# Patient Record
Sex: Female | Born: 1940 | Race: White | Hispanic: No | Marital: Single | Smoking: Never smoker
Health system: Southern US, Community
[De-identification: ages and names within clinical notes are randomized; demographics above are authoritative.]

## PROBLEM LIST (undated history)

## (undated) DIAGNOSIS — Z923 Personal history of irradiation: Secondary | ICD-10-CM

## (undated) DIAGNOSIS — Z853 Personal history of malignant neoplasm of breast: Secondary | ICD-10-CM

## (undated) DIAGNOSIS — I10 Essential (primary) hypertension: Secondary | ICD-10-CM

## (undated) DIAGNOSIS — E119 Type 2 diabetes mellitus without complications: Secondary | ICD-10-CM

## (undated) HISTORY — PX: DILATION AND CURETTAGE OF UTERUS: SHX78

## (undated) HISTORY — PX: BREAST LUMPECTOMY: SHX2

---

## 1996-06-23 HISTORY — PX: BREAST BIOPSY: SHX20

## 1997-09-25 ENCOUNTER — Ambulatory Visit (HOSPITAL_COMMUNITY): Admission: RE | Admit: 1997-09-25 | Discharge: 1997-09-25 | Payer: Self-pay | Admitting: Family Medicine

## 1997-12-27 ENCOUNTER — Ambulatory Visit (HOSPITAL_COMMUNITY): Admission: RE | Admit: 1997-12-27 | Discharge: 1997-12-27 | Payer: Self-pay | Admitting: Family Medicine

## 1998-07-12 ENCOUNTER — Other Ambulatory Visit: Admission: RE | Admit: 1998-07-12 | Discharge: 1998-07-12 | Payer: Self-pay | Admitting: Obstetrics and Gynecology

## 1999-04-30 ENCOUNTER — Encounter (INDEPENDENT_AMBULATORY_CARE_PROVIDER_SITE_OTHER): Payer: Self-pay | Admitting: Specialist

## 1999-04-30 ENCOUNTER — Other Ambulatory Visit: Admission: RE | Admit: 1999-04-30 | Discharge: 1999-04-30 | Payer: Self-pay | Admitting: Gynecology

## 1999-08-12 ENCOUNTER — Encounter: Payer: Self-pay | Admitting: Obstetrics and Gynecology

## 1999-08-12 ENCOUNTER — Encounter: Admission: RE | Admit: 1999-08-12 | Discharge: 1999-08-12 | Payer: Self-pay | Admitting: Obstetrics and Gynecology

## 2000-06-23 DIAGNOSIS — Z923 Personal history of irradiation: Secondary | ICD-10-CM

## 2000-06-23 HISTORY — DX: Personal history of irradiation: Z92.3

## 2000-06-23 HISTORY — PX: BREAST BIOPSY: SHX20

## 2000-08-12 ENCOUNTER — Encounter: Payer: Self-pay | Admitting: Family Medicine

## 2000-08-12 ENCOUNTER — Encounter: Admission: RE | Admit: 2000-08-12 | Discharge: 2000-08-12 | Payer: Self-pay | Admitting: Family Medicine

## 2000-08-17 ENCOUNTER — Encounter: Payer: Self-pay | Admitting: Family Medicine

## 2000-08-17 ENCOUNTER — Encounter: Admission: RE | Admit: 2000-08-17 | Discharge: 2000-08-17 | Payer: Self-pay | Admitting: *Deleted

## 2000-08-18 ENCOUNTER — Other Ambulatory Visit: Admission: RE | Admit: 2000-08-18 | Discharge: 2000-08-18 | Payer: Self-pay | Admitting: Family Medicine

## 2000-08-18 ENCOUNTER — Encounter: Payer: Self-pay | Admitting: Family Medicine

## 2000-08-18 ENCOUNTER — Encounter: Admission: RE | Admit: 2000-08-18 | Discharge: 2000-08-18 | Payer: Self-pay | Admitting: Family Medicine

## 2000-08-18 ENCOUNTER — Encounter (INDEPENDENT_AMBULATORY_CARE_PROVIDER_SITE_OTHER): Payer: Self-pay | Admitting: *Deleted

## 2000-09-01 ENCOUNTER — Encounter: Payer: Self-pay | Admitting: Surgery

## 2000-09-01 ENCOUNTER — Encounter: Admission: RE | Admit: 2000-09-01 | Discharge: 2000-09-01 | Payer: Self-pay | Admitting: Surgery

## 2000-09-03 ENCOUNTER — Encounter (INDEPENDENT_AMBULATORY_CARE_PROVIDER_SITE_OTHER): Payer: Self-pay | Admitting: *Deleted

## 2000-09-03 ENCOUNTER — Encounter: Payer: Self-pay | Admitting: Surgery

## 2000-09-03 ENCOUNTER — Encounter: Admission: RE | Admit: 2000-09-03 | Discharge: 2000-09-03 | Payer: Self-pay | Admitting: Surgery

## 2000-09-03 ENCOUNTER — Ambulatory Visit (HOSPITAL_BASED_OUTPATIENT_CLINIC_OR_DEPARTMENT_OTHER): Admission: RE | Admit: 2000-09-03 | Discharge: 2000-09-03 | Payer: Self-pay | Admitting: Surgery

## 2000-09-11 ENCOUNTER — Encounter: Admission: RE | Admit: 2000-09-11 | Discharge: 2000-12-10 | Payer: Self-pay | Admitting: Radiation Oncology

## 2001-07-06 ENCOUNTER — Encounter: Admission: RE | Admit: 2001-07-06 | Discharge: 2001-07-06 | Payer: Self-pay | Admitting: Radiation Oncology

## 2001-12-14 ENCOUNTER — Other Ambulatory Visit: Admission: RE | Admit: 2001-12-14 | Discharge: 2001-12-14 | Payer: Self-pay | Admitting: Obstetrics and Gynecology

## 2002-07-13 ENCOUNTER — Encounter: Payer: Self-pay | Admitting: Surgery

## 2002-07-13 ENCOUNTER — Encounter: Admission: RE | Admit: 2002-07-13 | Discharge: 2002-07-13 | Payer: Self-pay | Admitting: Surgery

## 2003-03-13 ENCOUNTER — Other Ambulatory Visit: Admission: RE | Admit: 2003-03-13 | Discharge: 2003-03-13 | Payer: Self-pay | Admitting: Obstetrics and Gynecology

## 2003-07-17 ENCOUNTER — Encounter: Admission: RE | Admit: 2003-07-17 | Discharge: 2003-07-17 | Payer: Self-pay | Admitting: Family Medicine

## 2004-04-24 ENCOUNTER — Other Ambulatory Visit: Admission: RE | Admit: 2004-04-24 | Discharge: 2004-04-24 | Payer: Self-pay | Admitting: Obstetrics and Gynecology

## 2004-07-17 ENCOUNTER — Encounter: Admission: RE | Admit: 2004-07-17 | Discharge: 2004-07-17 | Payer: Self-pay | Admitting: Obstetrics and Gynecology

## 2005-07-18 ENCOUNTER — Encounter: Admission: RE | Admit: 2005-07-18 | Discharge: 2005-07-18 | Payer: Self-pay | Admitting: Internal Medicine

## 2005-08-20 ENCOUNTER — Other Ambulatory Visit: Admission: RE | Admit: 2005-08-20 | Discharge: 2005-08-20 | Payer: Self-pay | Admitting: Internal Medicine

## 2006-07-20 ENCOUNTER — Encounter: Admission: RE | Admit: 2006-07-20 | Discharge: 2006-07-20 | Payer: Self-pay | Admitting: Internal Medicine

## 2006-09-22 ENCOUNTER — Other Ambulatory Visit: Admission: RE | Admit: 2006-09-22 | Discharge: 2006-09-22 | Payer: Self-pay | Admitting: Internal Medicine

## 2007-07-23 ENCOUNTER — Encounter: Admission: RE | Admit: 2007-07-23 | Discharge: 2007-07-23 | Payer: Self-pay | Admitting: Internal Medicine

## 2007-09-22 ENCOUNTER — Other Ambulatory Visit: Admission: RE | Admit: 2007-09-22 | Discharge: 2007-09-22 | Payer: Self-pay | Admitting: Internal Medicine

## 2008-07-27 ENCOUNTER — Encounter: Admission: RE | Admit: 2008-07-27 | Discharge: 2008-07-27 | Payer: Self-pay | Admitting: Internal Medicine

## 2008-09-27 ENCOUNTER — Other Ambulatory Visit: Admission: RE | Admit: 2008-09-27 | Discharge: 2008-09-27 | Payer: Self-pay | Admitting: Internal Medicine

## 2009-07-30 ENCOUNTER — Encounter: Admission: RE | Admit: 2009-07-30 | Discharge: 2009-07-30 | Payer: Self-pay | Admitting: Internal Medicine

## 2009-08-02 ENCOUNTER — Encounter: Admission: RE | Admit: 2009-08-02 | Discharge: 2009-08-02 | Payer: Self-pay | Admitting: Internal Medicine

## 2009-10-03 ENCOUNTER — Other Ambulatory Visit: Admission: RE | Admit: 2009-10-03 | Discharge: 2009-10-03 | Payer: Self-pay | Admitting: Internal Medicine

## 2009-10-09 ENCOUNTER — Encounter: Admission: RE | Admit: 2009-10-09 | Discharge: 2009-10-09 | Payer: Self-pay | Admitting: Internal Medicine

## 2010-01-31 ENCOUNTER — Encounter: Admission: RE | Admit: 2010-01-31 | Discharge: 2010-01-31 | Payer: Self-pay | Admitting: Surgery

## 2010-07-13 ENCOUNTER — Other Ambulatory Visit: Payer: Self-pay | Admitting: Surgery

## 2010-07-13 DIAGNOSIS — Z1239 Encounter for other screening for malignant neoplasm of breast: Secondary | ICD-10-CM

## 2010-07-13 DIAGNOSIS — Z1231 Encounter for screening mammogram for malignant neoplasm of breast: Secondary | ICD-10-CM

## 2010-08-01 ENCOUNTER — Ambulatory Visit
Admission: RE | Admit: 2010-08-01 | Discharge: 2010-08-01 | Disposition: A | Discharge status: Home / Self Care | Payer: MEDICARE | Source: Ambulatory Visit | Attending: Surgery | Admitting: Surgery

## 2010-08-01 DIAGNOSIS — Z1231 Encounter for screening mammogram for malignant neoplasm of breast: Secondary | ICD-10-CM

## 2010-10-07 ENCOUNTER — Other Ambulatory Visit (HOSPITAL_COMMUNITY)
Admission: RE | Admit: 2010-10-07 | Discharge: 2010-10-07 | Disposition: A | Discharge status: Home / Self Care | Payer: MEDICARE | Source: Ambulatory Visit | Attending: Internal Medicine | Admitting: Internal Medicine

## 2010-10-07 ENCOUNTER — Other Ambulatory Visit: Payer: Self-pay | Admitting: Internal Medicine

## 2010-10-07 DIAGNOSIS — Z01419 Encounter for gynecological examination (general) (routine) without abnormal findings: Secondary | ICD-10-CM | POA: Insufficient documentation

## 2010-11-08 NOTE — Op Note (Signed)
[place]  Patient:    Tiffany Hunt, Tiffany Hunt                      MRN: [id number] Proc. Date: 09/03/00 Attending:  Currie Paris, M.D. CC:         [place]. Andrey Campanile, M.D.  Daniel L. Eda Paschal, M.D.   Operative Report  CCS# [id number]  PREOPERATIVE DIAGNOSIS:  Ductal carcinoma in situ, left breast.  POSTOPERATIVE DIAGNOSIS:  Ductal carcinoma in situ, left breast.  OPERATION PERFORMED:  Needle guided excision, left breast cancer.  SURGEON:  Currie Paris, M.D.  ANESTHESIA:  General.  INDICATIONS FOR PROCEDURE:  The patient is a 70 year old who recently presented with some calcifications in which on large core biopsy showed cribriform ductal carcinoma in situ.  As this was a small area, she was considered a good candidate for wide local excision using a needle guided technique.  The tumor was in just about the 9 oclock or direct medial position although a little bit more in the lower inner than the upper inner quadrant.  DESCRIPTION OF PROCEDURE:  The patient was brought to the operating room having had a guide wire positioned and the guide wire entered just medial or towards on the sternal side of the prior core biopsy site and tracked towards the nipple.  After satisfactory general anesthesia had been obtained, the breast was prepped and draped.  I did a wide local excision by making an elliptical incision to start just medial to the guide wire and in right about the level of the areolar margin to encompass the old core biopsy site.  I raised a little bit of a flap in superior and inferior directions and then beginning medially, divided tissue down to chest wall and then working from medial to lateral began to divide and separate the breast from the underlying muscle taking the fascia with the specimen so that I had full thickness skin, breast and fascia.  The dissection ended just medial to the medial portion of the incision which  was somewhat subareolar.  This was all done with the cautery and I felt I was well around the area of abnormality.  This had a suture to localize it and the specimen was sent for mammography. This subsequently returned that the microclip was in the middle of the specimen and we looked like had at least a centimeter or two margin on any abnormality seen on the mammography.  I then elevated some full thickness breast tissue superiorly and inferiorly to get a little mobility for closure.  I put four metal clips on it to mark the margins for excision and then reapproximated the breast with some 3-0 Vicryl interrupted sutures.  I did a couple of layers one fairly deep in the breast tissue which I anchored to the muscle so that we would have less likelihood of a seroma developing and then a couple of more layers as we came out.  The skin was closed with 4-0 Monocryl subcuticular plus Steri-Strips.  The patient tolerated the procedure well.  There were no operative complications.  All counts were correct.  I did inject approximately 10 cc of Marcaine just prior to closing to help with postoperative analgesia. DD:  09/03/00 TD:  09/03/00 Job: [id number]

## 2011-02-26 ENCOUNTER — Other Ambulatory Visit: Payer: Self-pay | Admitting: Dermatology

## 2011-07-02 ENCOUNTER — Other Ambulatory Visit: Payer: Self-pay | Admitting: Internal Medicine

## 2011-07-02 DIAGNOSIS — Z1231 Encounter for screening mammogram for malignant neoplasm of breast: Secondary | ICD-10-CM

## 2011-08-04 ENCOUNTER — Ambulatory Visit
Admission: RE | Admit: 2011-08-04 | Discharge: 2011-08-04 | Disposition: A | Discharge status: Home / Self Care | Payer: PRIVATE HEALTH INSURANCE | Source: Ambulatory Visit | Attending: Internal Medicine | Admitting: Internal Medicine

## 2011-08-04 DIAGNOSIS — Z1231 Encounter for screening mammogram for malignant neoplasm of breast: Secondary | ICD-10-CM

## 2012-03-03 ENCOUNTER — Other Ambulatory Visit: Payer: Self-pay | Admitting: Dermatology

## 2012-07-06 ENCOUNTER — Other Ambulatory Visit: Payer: Self-pay | Admitting: Internal Medicine

## 2012-07-06 DIAGNOSIS — Z1231 Encounter for screening mammogram for malignant neoplasm of breast: Secondary | ICD-10-CM

## 2012-08-04 ENCOUNTER — Ambulatory Visit: Payer: PRIVATE HEALTH INSURANCE

## 2012-09-01 ENCOUNTER — Ambulatory Visit: Payer: PRIVATE HEALTH INSURANCE

## 2012-09-02 ENCOUNTER — Ambulatory Visit: Payer: PRIVATE HEALTH INSURANCE

## 2012-09-24 ENCOUNTER — Ambulatory Visit
Admission: RE | Admit: 2012-09-24 | Discharge: 2012-09-24 | Disposition: A | Discharge status: Home / Self Care | Payer: Medicare Other | Source: Ambulatory Visit | Attending: Internal Medicine | Admitting: Internal Medicine

## 2012-09-24 DIAGNOSIS — Z1231 Encounter for screening mammogram for malignant neoplasm of breast: Secondary | ICD-10-CM

## 2012-11-08 ENCOUNTER — Other Ambulatory Visit: Payer: Self-pay | Admitting: Internal Medicine

## 2012-11-08 ENCOUNTER — Other Ambulatory Visit (HOSPITAL_COMMUNITY)
Admission: RE | Admit: 2012-11-08 | Discharge: 2012-11-08 | Disposition: A | Discharge status: Home / Self Care | Payer: Medicare Other | Source: Ambulatory Visit | Attending: Internal Medicine | Admitting: Internal Medicine

## 2012-11-08 DIAGNOSIS — Z1151 Encounter for screening for human papillomavirus (HPV): Secondary | ICD-10-CM | POA: Insufficient documentation

## 2012-11-08 DIAGNOSIS — Z01419 Encounter for gynecological examination (general) (routine) without abnormal findings: Secondary | ICD-10-CM | POA: Insufficient documentation

## 2013-07-27 ENCOUNTER — Ambulatory Visit (HOSPITAL_COMMUNITY): Payer: Medicare Other | Admitting: Anesthesiology

## 2013-07-27 ENCOUNTER — Encounter (HOSPITAL_COMMUNITY): Payer: Self-pay | Admitting: *Deleted

## 2013-07-27 ENCOUNTER — Encounter (HOSPITAL_COMMUNITY)
Admission: AD | Disposition: A | Discharge status: Home / Self Care | Payer: Self-pay | Source: Ambulatory Visit | Attending: Orthopaedic Surgery

## 2013-07-27 ENCOUNTER — Ambulatory Visit (HOSPITAL_COMMUNITY)
Admission: AD | Admit: 2013-07-27 | Discharge: 2013-07-27 | Disposition: A | Discharge status: Home / Self Care | Payer: Medicare Other | Source: Ambulatory Visit | Attending: Orthopaedic Surgery | Admitting: Orthopaedic Surgery

## 2013-07-27 ENCOUNTER — Ambulatory Visit (HOSPITAL_COMMUNITY): Payer: Medicare Other

## 2013-07-27 ENCOUNTER — Encounter (HOSPITAL_COMMUNITY): Payer: Medicare Other | Admitting: Anesthesiology

## 2013-07-27 DIAGNOSIS — Z853 Personal history of malignant neoplasm of breast: Secondary | ICD-10-CM | POA: Insufficient documentation

## 2013-07-27 DIAGNOSIS — W19XXXA Unspecified fall, initial encounter: Secondary | ICD-10-CM | POA: Insufficient documentation

## 2013-07-27 DIAGNOSIS — S52602A Unspecified fracture of lower end of left ulna, initial encounter for closed fracture: Secondary | ICD-10-CM

## 2013-07-27 DIAGNOSIS — S52599A Other fractures of lower end of unspecified radius, initial encounter for closed fracture: Secondary | ICD-10-CM | POA: Insufficient documentation

## 2013-07-27 DIAGNOSIS — I10 Essential (primary) hypertension: Secondary | ICD-10-CM | POA: Insufficient documentation

## 2013-07-27 DIAGNOSIS — E119 Type 2 diabetes mellitus without complications: Secondary | ICD-10-CM | POA: Insufficient documentation

## 2013-07-27 DIAGNOSIS — S52502A Unspecified fracture of the lower end of left radius, initial encounter for closed fracture: Secondary | ICD-10-CM | POA: Diagnosis present

## 2013-07-27 HISTORY — DX: Essential (primary) hypertension: I10

## 2013-07-27 HISTORY — PX: OPEN REDUCTION INTERNAL FIXATION (ORIF) DISTAL RADIAL FRACTURE: SHX5989

## 2013-07-27 HISTORY — DX: Type 2 diabetes mellitus without complications: E11.9

## 2013-07-27 HISTORY — DX: Personal history of malignant neoplasm of breast: Z85.3

## 2013-07-27 LAB — CBC
HCT: 40 % (ref 36.0–46.0)
Hemoglobin: 13.9 g/dL (ref 12.0–15.0)
MCH: 30.3 pg (ref 26.0–34.0)
MCHC: 34.8 g/dL (ref 30.0–36.0)
MCV: 87.3 fL (ref 78.0–100.0)
Platelets: 289 10*3/uL (ref 150–400)
RBC: 4.58 MIL/uL (ref 3.87–5.11)
RDW: 13.3 % (ref 11.5–15.5)
WBC: 8.9 10*3/uL (ref 4.0–10.5)

## 2013-07-27 LAB — BASIC METABOLIC PANEL
BUN: 13 mg/dL (ref 6–23)
CO2: 24 mEq/L (ref 19–32)
Calcium: 9 mg/dL (ref 8.4–10.5)
Chloride: 101 mEq/L (ref 96–112)
Creatinine, Ser: 0.49 mg/dL — ABNORMAL LOW (ref 0.50–1.10)
GFR calc Af Amer: >90 mL/min (ref >90)
GFR calc non Af Amer: >90 mL/min (ref >90)
Glucose, Bld: 140 mg/dL — ABNORMAL HIGH (ref 70–99)
Potassium: 3.7 mEq/L (ref 3.7–5.3)
Sodium: 140 mEq/L (ref 137–147)

## 2013-07-27 LAB — GLUCOSE, CAPILLARY
Glucose-Capillary: 131 mg/dL — ABNORMAL HIGH (ref 70–99)
Glucose-Capillary: 104 mg/dL — ABNORMAL HIGH (ref 70–99)
Glucose-Capillary: 103 mg/dL — ABNORMAL HIGH (ref 70–99)

## 2013-07-27 SURGERY — OPEN REDUCTION INTERNAL FIXATION (ORIF) DISTAL RADIUS FRACTURE
Anesthesia: General | Site: Wrist | Laterality: Left

## 2013-07-27 MED ORDER — CEFAZOLIN SODIUM-DEXTROSE 2-3 GM-% IV SOLR
2.0000 g | INTRAVENOUS | Status: AC
  Administered 2013-07-27: 2 g via INTRAVENOUS
  Filled 2013-07-27: qty 50

## 2013-07-27 MED ORDER — BUPIVACAINE HCL (PF) 0.5 % IJ SOLN
INTRAMUSCULAR | Status: DC | PRN
  Administered 2013-07-27: 7 mL

## 2013-07-27 MED ORDER — ONDANSETRON HCL 4 MG/2ML IJ SOLN
INTRAMUSCULAR | Status: AC
  Administered 2013-07-27: 4 mg via INTRAVENOUS
  Filled 2013-07-27: qty 2

## 2013-07-27 MED ORDER — OXYCODONE HCL 5 MG PO TABS: 5.0000 mg | ORAL_TABLET | Freq: Once | ORAL | Status: DC | PRN

## 2013-07-27 MED ORDER — MIDAZOLAM HCL 2 MG/2ML IJ SOLN
INTRAMUSCULAR | Status: AC
  Filled 2013-07-27: qty 2

## 2013-07-27 MED ORDER — HYDROCODONE-ACETAMINOPHEN 5-325 MG PO TABS: 1.0000 | ORAL_TABLET | ORAL | Status: DC | PRN

## 2013-07-27 MED ORDER — PROPOFOL 10 MG/ML IV BOLUS
INTRAVENOUS | Status: DC | PRN
  Administered 2013-07-27: 150 mg via INTRAVENOUS

## 2013-07-27 MED ORDER — METOCLOPRAMIDE HCL 5 MG PO TABS
5.0000 mg | ORAL_TABLET | Freq: Three times a day (TID) | ORAL | Status: DC | PRN
  Filled 2013-07-27: qty 2

## 2013-07-27 MED ORDER — PROPOFOL 10 MG/ML IV BOLUS
INTRAVENOUS | Status: AC
  Filled 2013-07-27: qty 20

## 2013-07-27 MED ORDER — HYDROMORPHONE HCL PF 1 MG/ML IJ SOLN: 0.2500 mg | INTRAMUSCULAR | Status: DC | PRN

## 2013-07-27 MED ORDER — LIDOCAINE HCL (CARDIAC) 20 MG/ML IV SOLN
INTRAVENOUS | Status: AC
  Filled 2013-07-27: qty 5

## 2013-07-27 MED ORDER — ONDANSETRON HCL 4 MG PO TABS
4.0000 mg | ORAL_TABLET | Freq: Four times a day (QID) | ORAL | Status: DC | PRN
  Filled 2013-07-27: qty 1

## 2013-07-27 MED ORDER — METOCLOPRAMIDE HCL 5 MG/ML IJ SOLN
5.0000 mg | Freq: Three times a day (TID) | INTRAMUSCULAR | Status: DC | PRN
  Filled 2013-07-27: qty 2

## 2013-07-27 MED ORDER — PROMETHAZINE HCL 25 MG/ML IJ SOLN: 6.2500 mg | INTRAMUSCULAR | Status: DC | PRN

## 2013-07-27 MED ORDER — FENTANYL CITRATE 0.05 MG/ML IJ SOLN
INTRAMUSCULAR | Status: AC
  Filled 2013-07-27: qty 5

## 2013-07-27 MED ORDER — MEPERIDINE HCL 25 MG/ML IJ SOLN: 6.2500 mg | INTRAMUSCULAR | Status: DC | PRN

## 2013-07-27 MED ORDER — PHENYLEPHRINE HCL 10 MG/ML IJ SOLN
INTRAMUSCULAR | Status: DC | PRN
  Administered 2013-07-27: 80 ug via INTRAVENOUS

## 2013-07-27 MED ORDER — CHLORHEXIDINE GLUCONATE 4 % EX LIQD: 60.0000 mL | Freq: Once | CUTANEOUS | Status: DC

## 2013-07-27 MED ORDER — HYDROCODONE-ACETAMINOPHEN 5-325 MG PO TABS: 1.0000 | ORAL_TABLET | ORAL | Status: AC | PRN

## 2013-07-27 MED ORDER — OXYCODONE HCL 5 MG/5ML PO SOLN: 5.0000 mg | Freq: Once | ORAL | Status: DC | PRN

## 2013-07-27 MED ORDER — KCL IN DEXTROSE-NACL 20-5-0.45 MEQ/L-%-% IV SOLN
INTRAVENOUS | Status: DC
  Filled 2013-07-27 (×2): qty 1000

## 2013-07-27 MED ORDER — LACTATED RINGERS IV SOLN
INTRAVENOUS | Status: DC
  Administered 2013-07-27: 15:00:00 via INTRAVENOUS

## 2013-07-27 MED ORDER — OXYCODONE-ACETAMINOPHEN 5-325 MG PO TABS: 1.0000 | ORAL_TABLET | ORAL | Status: DC | PRN

## 2013-07-27 MED ORDER — ONDANSETRON HCL 4 MG/2ML IJ SOLN
4.0000 mg | Freq: Four times a day (QID) | INTRAMUSCULAR | Status: DC | PRN
  Administered 2013-07-27: 4 mg via INTRAVENOUS
  Filled 2013-07-27: qty 2

## 2013-07-27 MED ORDER — ONDANSETRON HCL 4 MG/2ML IJ SOLN
INTRAMUSCULAR | Status: AC
  Filled 2013-07-27: qty 2

## 2013-07-27 MED ORDER — BUPIVACAINE HCL (PF) 0.5 % IJ SOLN
INTRAMUSCULAR | Status: AC
  Filled 2013-07-27: qty 10

## 2013-07-27 MED ORDER — FENTANYL CITRATE 0.05 MG/ML IJ SOLN
INTRAMUSCULAR | Status: AC
  Filled 2013-07-27: qty 2

## 2013-07-27 MED ORDER — LIDOCAINE HCL (CARDIAC) 20 MG/ML IV SOLN
INTRAVENOUS | Status: DC | PRN
  Administered 2013-07-27: 80 mg via INTRAVENOUS

## 2013-07-27 MED ORDER — MORPHINE SULFATE 2 MG/ML IJ SOLN: 1.0000 mg | INTRAMUSCULAR | Status: DC | PRN

## 2013-07-27 MED ORDER — LACTATED RINGERS IV SOLN
INTRAVENOUS | Status: DC | PRN
  Administered 2013-07-27: 19:00:00 via INTRAVENOUS

## 2013-07-27 MED ORDER — FENTANYL CITRATE 0.05 MG/ML IJ SOLN
INTRAMUSCULAR | Status: DC | PRN
  Administered 2013-07-27 (×2): 50 ug via INTRAVENOUS

## 2013-07-27 MED ORDER — BUPIVACAINE-EPINEPHRINE PF 0.5-1:200000 % IJ SOLN
INTRAMUSCULAR | Status: DC | PRN
  Administered 2013-07-27: 30 mL via PERINEURAL

## 2013-07-27 SURGICAL SUPPLY — 64 items
APL SKNCLS STERI-STRIP NONHPOA (GAUZE/BANDAGES/DRESSINGS) ×1
BANDAGE ELASTIC 4 VELCRO ST LF (GAUZE/BANDAGES/DRESSINGS) ×1 IMPLANT
BENZOIN TINCTURE PRP APPL 2/3 (GAUZE/BANDAGES/DRESSINGS) ×1 IMPLANT
BIT DRILL 2 FAST STEP (BIT) ×1 IMPLANT
BIT DRILL 2.5X4 QC (BIT) ×1 IMPLANT
BNDG CMPR 9X4 STRL LF SNTH (GAUZE/BANDAGES/DRESSINGS) ×1
BNDG ESMARK 4X9 LF (GAUZE/BANDAGES/DRESSINGS) ×1 IMPLANT
CLOTH BEACON ORANGE TIMEOUT ST (SAFETY) ×1 IMPLANT
CORDS BIPOLAR (ELECTRODE) ×1 IMPLANT
COVER SURGICAL LIGHT HANDLE (MISCELLANEOUS) ×2 IMPLANT
DRAPE OEC MINIVIEW 54X84 (DRAPES) ×1 IMPLANT
DRSG PAD ABDOMINAL 8X10 ST (GAUZE/BANDAGES/DRESSINGS) IMPLANT
DURAPREP 26ML APPLICATOR (WOUND CARE) ×2 IMPLANT
ELECT REM PT RETURN 9FT ADLT (ELECTROSURGICAL) ×2
ELECTRODE REM PT RTRN 9FT ADLT (ELECTROSURGICAL) ×1 IMPLANT
GAUZE XEROFORM 1X8 LF (GAUZE/BANDAGES/DRESSINGS) ×1 IMPLANT
GLOVE BIOGEL PI IND STRL 7.5 (GLOVE) ×1 IMPLANT
GLOVE BIOGEL PI IND STRL 8 (GLOVE) ×1 IMPLANT
GLOVE BIOGEL PI INDICATOR 7.5 (GLOVE)
GLOVE BIOGEL PI INDICATOR 8 (GLOVE) ×1
GLOVE ECLIPSE 7.0 STRL STRAW (GLOVE) ×1 IMPLANT
GLOVE ORTHO TXT STRL SZ7.5 (GLOVE) ×2 IMPLANT
GOWN PREVENTION PLUS LG XLONG (DISPOSABLE) IMPLANT
GOWN PREVENTION PLUS XLARGE (GOWN DISPOSABLE) ×2 IMPLANT
GOWN STRL NON-REIN LRG LVL3 (GOWN DISPOSABLE) ×2 IMPLANT
K-WIRE 1.4X100 (WIRE)
K-WIRE 1.6 (WIRE) ×4
K-WIRE FX5X1.6XNS BN SS (WIRE) ×2
KIT BASIN OR (CUSTOM PROCEDURE TRAY) ×2 IMPLANT
KIT ROOM TURNOVER OR (KITS) ×2 IMPLANT
KWIRE 1.4X100 (WIRE) IMPLANT
KWIRE FX5X1.6XNS BN SS (WIRE) IMPLANT
MANIFOLD NEPTUNE II (INSTRUMENTS) ×1 IMPLANT
NEEDLE 22X1 1/2 (OR ONLY) (NEEDLE) ×1 IMPLANT
NS IRRIG 1000ML POUR BTL (IV SOLUTION) ×2 IMPLANT
PACK ORTHO EXTREMITY (CUSTOM PROCEDURE TRAY) ×2 IMPLANT
PAD ARMBOARD 7.5X6 YLW CONV (MISCELLANEOUS) ×4 IMPLANT
PAD CAST 4YDX4 CTTN HI CHSV (CAST SUPPLIES) IMPLANT
PADDING CAST COTTON 4X4 STRL (CAST SUPPLIES) ×2
PEG SUBCHONDRAL SMOOTH 2.0X18 (Peg) ×1 IMPLANT
PEG SUBCHONDRAL SMOOTH 2.0X20 (Peg) ×6 IMPLANT
PLATE SHORT 24.4X51.3 LT (Plate) ×1 IMPLANT
SCREW BN 12X3.5XNS CORT TI (Screw) IMPLANT
SCREW CORT 3.5X10 LNG (Screw) ×1 IMPLANT
SCREW CORT 3.5X12 (Screw) ×2 IMPLANT
SPLINT PLASTER CAST XFAST 4X15 (CAST SUPPLIES) IMPLANT
SPLINT PLASTER XTRA FAST SET 4 (CAST SUPPLIES) ×1
SPONGE GAUZE 4X4 12PLY (GAUZE/BANDAGES/DRESSINGS) ×2 IMPLANT
SPONGE LAP 4X18 X RAY DECT (DISPOSABLE) ×2 IMPLANT
STAPLER VISISTAT 35W (STAPLE) ×1 IMPLANT
STRIP CLOSURE SKIN 1/2X4 (GAUZE/BANDAGES/DRESSINGS) ×2 IMPLANT
SUCTION FRAZIER TIP 10 FR DISP (SUCTIONS) ×2 IMPLANT
SUT PROLENE 3 0 PS 1 (SUTURE) ×1 IMPLANT
SUT VIC AB 2-0 CT1 27 (SUTURE) ×2
SUT VIC AB 2-0 CT1 TAPERPNT 27 (SUTURE) IMPLANT
SUT VIC AB 3-0 X1 27 (SUTURE) ×1 IMPLANT
SUT VICRYL 4-0 PS2 18IN ABS (SUTURE) ×1 IMPLANT
SYR CONTROL 10ML LL (SYRINGE) ×1 IMPLANT
TOWEL OR 17X24 6PK STRL BLUE (TOWEL DISPOSABLE) ×2 IMPLANT
TOWEL OR 17X26 10 PK STRL BLUE (TOWEL DISPOSABLE) ×2 IMPLANT
TUBE CONNECTING 12X1/4 (SUCTIONS) ×2 IMPLANT
UNDERPAD 30X30 INCONTINENT (UNDERPADS AND DIAPERS) ×2 IMPLANT
WATER STERILE IRR 1000ML POUR (IV SOLUTION) ×2 IMPLANT
YANKAUER SUCT BULB TIP NO VENT (SUCTIONS) ×1 IMPLANT

## 2013-07-27 NOTE — Anesthesia Procedure Notes (Addendum)
Anesthesia Regional Block:  Supraclavicular block  Pre-Anesthetic Checklist: ,, timeout performed, Correct Patient, Correct Site, Correct Laterality, Correct Procedure, Correct Position, site marked, Risks and benefits discussed,  Surgical consent,  Pre-op evaluation,  At surgeon's request and post-op pain management  Laterality: Left  Prep: chloraprep       Needles:  Injection technique: Single-shot  Needle Type: Echogenic Stimulator Needle     Needle Length: 5cm 5 cm Needle Gauge: 22 and 22 G    Additional Needles:  Procedures: ultrasound guided (picture in chart) and nerve stimulator Supraclavicular block  Nerve Stimulator or Paresthesia:  Response: biceps flexion, 0.45 mA,   Additional Responses:   Narrative:  Start time: 07/27/2013 6:51 PM End time: 07/27/2013 7:03 PM Injection made incrementally with aspirations every 5 mL.  Performed by: Personally  Anesthesiologist: Dr Marcie Bal  Additional Notes: Functioning IV was confirmed and monitors were applied.  A 49mm 22ga Arrow echogenic stimulator needle was used. Sterile prep and drape,hand hygiene and sterile gloves were used.  Negative aspiration and negative test dose prior to incremental administration of local anesthetic. The patient tolerated the procedure well.  Ultrasound guidance: relevent anatomy identified, needle position confirmed, local anesthetic spread visualized around nerve(s), vascular puncture avoided.  Image printed for medical record.    Procedure Name: LMA Insertion Date/Time: 07/27/2013 7:33 PM Performed by: Eligha Bridegroom Pre-anesthesia Checklist: Patient identified, Timeout performed, Emergency Drugs available, Suction available and Patient being monitored Patient Re-evaluated:Patient Re-evaluated prior to inductionOxygen Delivery Method: Circle system utilized Preoxygenation: Pre-oxygenation with 100% oxygen Intubation Type: IV induction LMA: LMA inserted LMA Size: 4.0 Number of attempts:  1 Placement Confirmation: positive ETCO2 and breath sounds checked- equal and bilateral Tube secured with: Tape Dental Injury: Teeth and Oropharynx as per pre-operative assessment

## 2013-07-27 NOTE — Anesthesia Preprocedure Evaluation (Addendum)
Anesthesia Evaluation  Patient identified by MRN, date of birth, ID band Patient awake    Reviewed: Allergy & Precautions, H&P , NPO status , Patient's Chart, lab work & pertinent test results  History of Anesthesia Complications Negative for: history of anesthetic complications  Airway Mallampati: II TM Distance: >3 FB Neck ROM: Full    Dental  (+) Teeth Intact and Dental Advisory Given   Pulmonary neg pulmonary ROS,  breath sounds clear to auscultation  Pulmonary exam normal       Cardiovascular hypertension, Pt. on medications Rhythm:Regular Rate:Normal     Neuro/Psych negative neurological ROS     GI/Hepatic negative GI ROS, Neg liver ROS,   Endo/Other  diabetes (glu 104), Type 2, Oral Hypoglycemic Agents  Renal/GU negative Renal ROS     Musculoskeletal   Abdominal   Peds  Hematology negative hematology ROS (+)   Anesthesia Other Findings H/o breast cancer  Reproductive/Obstetrics                         Anesthesia Physical Anesthesia Plan  ASA: II  Anesthesia Plan:    Post-op Pain Management:    Induction:   Airway Management Planned:   Additional Equipment:   Intra-op Plan:   Post-operative Plan:   Informed Consent:   Plan Discussed with:   Anesthesia Plan Comments:         Anesthesia Quick Evaluation

## 2013-07-27 NOTE — Interval H&P Note (Signed)
History and Physical Interval Note:  07/27/2013 6:49 PM  Tiffany Hunt  has presented today for surgery, with the diagnosis of Left Distal Radius Fracture  The various methods of treatment have been discussed with the patient and family. After consideration of risks, benefits and other options for treatment, the patient has consented to  Procedure(s) with comments: OPEN REDUCTION INTERNAL FIXATION (ORIF) DISTAL RADIAL FRACTURE (Left) - Open Reduction Internal Fixation Left Distal Radius as a surgical intervention .  The patient's history has been reviewed, patient examined, no change in status, stable for surgery.  I have reviewed the patient's chart and labs.  Questions were answered to the patient's satisfaction.     Robertson Colclough C

## 2013-07-27 NOTE — Progress Notes (Addendum)
Report given to Mickel Baas, RN call light within reach

## 2013-07-27 NOTE — Anesthesia Postprocedure Evaluation (Signed)
Anesthesia Post Note  Patient: Tiffany Hunt  Procedure(s) Performed: Procedure(s) (LRB): OPEN REDUCTION INTERNAL FIXATION (ORIF) DISTAL RADIAL FRACTURE (Left)  Anesthesia type: General  Patient location: PACU  Post pain: Pain level controlled and Adequate analgesia  Post assessment: Post-op Vital signs reviewed, Patient's Cardiovascular Status Stable, Respiratory Function Stable, Patent Airway and Pain level controlled  Last Vitals:  Filed Vitals:   07/27/13 2100  BP: 149/65  Pulse:   Temp:   Resp:     Post vital signs: Reviewed and stable  Level of consciousness: awake, alert  and oriented  Complications: No apparent anesthesia complications

## 2013-07-27 NOTE — Transfer of Care (Signed)
Immediate Anesthesia Transfer of Care Note  Patient: Tiffany Hunt  Procedure(s) Performed: Procedure(s) with comments: OPEN REDUCTION INTERNAL FIXATION (ORIF) DISTAL RADIAL FRACTURE (Left) - Open Reduction Internal Fixation Left Distal Radius  Patient Location: PACU  Anesthesia Type:General  Level of Consciousness: awake, alert  and oriented  Airway & Oxygen Therapy: Patient Spontanous Breathing and Patient connected to nasal cannula oxygen  Post-op Assessment: Report given to PACU RN  Post vital signs: Reviewed and stable  Complications: No apparent anesthesia complications

## 2013-07-27 NOTE — Brief Op Note (Signed)
07/27/2013  8:54 PM  PATIENT:  Tiffany Hunt  73 y.o. female  PRE-OPERATIVE DIAGNOSIS:  Left Distal Radius Fracture  POST-OPERATIVE DIAGNOSIS:  Left Distal Radius Fracture  PROCEDURE:  Procedure(s) with comments: OPEN REDUCTION INTERNAL FIXATION (ORIF) DISTAL RADIAL FRACTURE (Left) - Open Reduction Internal Fixation Left Distal Radius  SURGEON:  Surgeon(s) and Role:    * Marybelle Killings, MD - Primary  PHYSICIAN ASSISTANT:   ASSISTANTS: none   ANESTHESIA:   general  EBL:     BLOOD ADMINISTERED:none  DRAINS: none   LOCAL MEDICATIONS USED:  MARCAINE     SPECIMEN:  No Specimen  DISPOSITION OF SPECIMEN:  N/A  COUNTS:  YES  TOURNIQUET:   Total Tourniquet Time Documented: area (laterality) - 36 minutes Total: area (laterality) - 36 minutes   DICTATION: .Other Dictation: Dictation Number [id number]  PLAN OF CARE: Discharge to home after PACU  PATIENT DISPOSITION:  PACU - hemodynamically stable.   Delay start of Pharmacological VTE agent (>24hrs) due to surgical blood loss or risk of bleeding: not applicable

## 2013-07-27 NOTE — Discharge Instructions (Signed)
Keep splint dry and clean.  Elevate hand above heart when at rest.  Move fingers frequently. Do not lift, push or pull with left hand.  Non weight bearing on left arm/hand

## 2013-07-27 NOTE — H&P (Addendum)
Tiffany Hunt is an 73 y.o. female.   Chief Complaint: broken wrist left HPI: Pt tripped and fell on outstretched hand 07/26/2013.  Seen at [facility] with edema and deformity of left wrist.  Radiographs with displaced, comminuted fracture of the left distal radius and ulna styloid fracture. Closed manipulation and splinting performed.  Pt instructed in ice, elevation and treatment options.  Pt will undergo ORIF of left distal radius as outpatient.   Past Medical History  Diagnosis Date  . Diabetes mellitus without complication   . Hypertension   . History of breast cancer     Past Surgical History  Procedure Laterality Date  . Breast lumpectomy Left   . Dilation and curettage of uterus      No family history on file. Social History:  reports that she has never smoked. She does not have any smokeless tobacco history on file. She reports that she does not drink alcohol or use illicit drugs.  Allergies:  Allergies  Allergen Reactions  . Lidocaine-Epinephrine Palpitations    Medications Prior to Admission  Medication Sig Dispense Refill  . amLODipine (NORVASC) 5 MG tablet Take 5 mg by mouth daily.      Marland Kitchen atorvastatin (LIPITOR) 20 MG tablet Take 20 mg by mouth daily.      . benazepril-hydrochlorthiazide (LOTENSIN HCT) 10-12.5 MG per tablet Take 1 tablet by mouth daily.      . cholecalciferol (VITAMIN D) 1000 UNITS tablet Take 2,000 Units by mouth daily.      Marland Kitchen HYDROcodone-acetaminophen (NORCO/VICODIN) 5-325 MG per tablet Take 0.5 tablets by mouth every 4 (four) hours as needed for moderate pain.      Marland Kitchen loratadine (CLARITIN) 10 MG tablet Take 10 mg by mouth daily as needed for allergies.      . metFORMIN (GLUCOPHAGE) 500 MG tablet Take 500 mg by mouth 2 (two) times daily with a meal.        Results for orders placed during the hospital encounter of 07/27/13 (from the past 48 hour(s))  GLUCOSE, CAPILLARY     Status: Abnormal   Collection Time    07/27/13  2:32 PM      Result  Value Range   Glucose-Capillary 131 (*) 70 - 99 mg/dL   No results found.  Review of Systems  Musculoskeletal:       Left wrist pain  All other systems reviewed and are negative.    Pulse 95, temperature 98.3 F (36.8 C), temperature source Oral, resp. rate 18, height 5\' 3"  (1.6 m), weight 58.769 kg (129 lb 9 oz), SpO2 100.00%. Physical Exam  Constitutional: She is oriented to person, place, and time. She appears well-developed and well-nourished.  HENT:  Head: Normocephalic and atraumatic.  Eyes: Conjunctivae are normal. Pupils are equal, round, and reactive to light.  Cardiovascular: Normal rate.   Respiratory: Effort normal.  GI: Soft.  Musculoskeletal:  Edema of fingers of left hand.  Splint intact.  Moving fingers and cap refill and sensation intact left hand.  Neurological: She is alert and oriented to person, place, and time.     Assessment/Plan Displaced and comminuted fracture of left distal radius  PLAN:  ORIF of left distal radius fracture.  Romayne Ticas M 07/27/2013, 2:56 PM

## 2013-07-27 NOTE — Preoperative (Signed)
Beta Blockers   Reason not to administer Beta Blockers:Not Applicable 

## 2013-07-28 NOTE — Op Note (Signed)
Tiffany Hunt, Tiffany Hunt                ACCOUNT NO.:  [id number]  MEDICAL RECORD NO.:  [id number]  LOCATION:  [facility]                         FACILITY:  [facility]  PHYSICIAN:  Saina Waage C. Lorin Mercy, M.D.    DATE OF BIRTH:  [birth year]  DATE OF PROCEDURE:  07/27/2013 DATE OF DISCHARGE:  07/27/2013                              OPERATIVE REPORT   PREOPERATIVE DIAGNOSIS:  Left distal radius fracture, comminuted, 3 part, with fixation of 2 distal articular fragments.  POSTOPERATIVE DIAGNOSIS:  Left distal radius fracture, comminuted, 3 part, with fixation of 2 distal articular fragments.  SURGEON:  Reni Hausner C. Lorin Mercy, M.D.  ANESTHESIA:  General LMA.  TOURNIQUET TIME:  37 minutes.  DESCRIPTION OF PROCEDURE:  After induction of anesthesia, preoperative Ancef, prophylaxis, proximal arm tourniquet, prepping with DuraPrep, usual extremity sheets, stockinette drapes were applied.  Time-out procedure completed.  Sterile skin marker used overlying the flexor carpi radialis tendon.  The arm was wrapped in Esmarch, tourniquet inflated to 270 mm of pressure.  Incision was made directly over the FCR tendon.  It was taken toward the radial artery used for protection. Pronator was peeled off.  The radius fracture was identified, reduced. Reduction was difficult due to the distal styloid piece being shifted more toward the radial side.  It was reduced, held with pin through the styloid.  Standard plate hand innovation Biomet was selected.  Once it was adjusted in appropriate position, sliding screw was filled.  This allowed the plate to be slightly a little bit too distal and the second hole drilled in the slotted plate.  Plate was slid more proximally and then tightened down.  This was a 12-mm screw.  More proximally, a 10 mm bicortical screw was placed.  K-wire placed through the distal K-wire hole in the plate, showed good position, and was not in the joint, held the distal fragment.  Screws were then filled starting  distal row first and extending the proximal row going from the ulna to the radial aspect using 18, 20 mm smooth pegs.  All pegs were inside the bone, some just went 1 mm to the opposite cortex.  There was restoration of volar tilt. It was out to length.  No screws were in the distal RU joint. Tourniquet was deflated.  Final spot pictures were taken.  Wound was irrigated.  A 2-0 Vicryl placed in subcutaneous tissue, 4-0 Vicryl subcuticular closure.  Tincture of benzoin, Steri-Strips, Marcaine infiltration, and a short arm splint was applied with __________, Xeroform 4x4s, Webril, and Ace wrap. Instrument count and needle count was correct.  The patient transferred to recovery room in stable condition.     Chrisoula Zegarra C. Lorin Mercy, M.D.     MCY/MEDQ  D:  07/27/2013  T:  07/28/2013  Job:  [id number]

## 2013-08-01 ENCOUNTER — Encounter (HOSPITAL_COMMUNITY): Payer: Self-pay | Admitting: Orthopaedic Surgery

## 2013-10-07 ENCOUNTER — Other Ambulatory Visit: Payer: Self-pay

## 2013-10-07 DIAGNOSIS — Z1231 Encounter for screening mammogram for malignant neoplasm of breast: Secondary | ICD-10-CM

## 2013-10-27 ENCOUNTER — Encounter (INDEPENDENT_AMBULATORY_CARE_PROVIDER_SITE_OTHER): Payer: Self-pay

## 2013-10-27 ENCOUNTER — Ambulatory Visit
Admission: RE | Admit: 2013-10-27 | Discharge: 2013-10-27 | Disposition: A | Discharge status: Home / Self Care | Payer: Medicare Other | Source: Ambulatory Visit

## 2013-10-27 DIAGNOSIS — Z1231 Encounter for screening mammogram for malignant neoplasm of breast: Secondary | ICD-10-CM

## 2014-03-20 ENCOUNTER — Other Ambulatory Visit: Payer: Self-pay | Admitting: Dermatology

## 2014-04-07 ENCOUNTER — Other Ambulatory Visit: Payer: Self-pay

## 2014-10-09 ENCOUNTER — Other Ambulatory Visit: Payer: Self-pay

## 2014-10-09 DIAGNOSIS — Z1231 Encounter for screening mammogram for malignant neoplasm of breast: Secondary | ICD-10-CM

## 2014-10-31 ENCOUNTER — Ambulatory Visit
Admission: RE | Admit: 2014-10-31 | Discharge: 2014-10-31 | Disposition: A | Discharge status: Home / Self Care | Payer: Medicare Other | Source: Ambulatory Visit

## 2014-10-31 DIAGNOSIS — Z1231 Encounter for screening mammogram for malignant neoplasm of breast: Secondary | ICD-10-CM

## 2014-12-18 ENCOUNTER — Other Ambulatory Visit: Payer: Self-pay

## 2015-05-19 ENCOUNTER — Encounter: Payer: Self-pay | Admitting: Emergency Medicine

## 2015-05-19 ENCOUNTER — Emergency Department: Payer: Medicare Other

## 2015-05-19 ENCOUNTER — Emergency Department
Admission: EM | Admit: 2015-05-19 | Discharge: 2015-05-19 | Disposition: A | Discharge status: Home / Self Care | Payer: Medicare Other | Attending: Emergency Medicine | Admitting: Emergency Medicine

## 2015-05-19 DIAGNOSIS — Y9389 Activity, other specified: Secondary | ICD-10-CM | POA: Insufficient documentation

## 2015-05-19 DIAGNOSIS — S62002A Unspecified fracture of navicular [scaphoid] bone of left wrist, initial encounter for closed fracture: Secondary | ICD-10-CM

## 2015-05-19 DIAGNOSIS — I10 Essential (primary) hypertension: Secondary | ICD-10-CM | POA: Diagnosis not present

## 2015-05-19 DIAGNOSIS — Z23 Encounter for immunization: Secondary | ICD-10-CM | POA: Insufficient documentation

## 2015-05-19 DIAGNOSIS — S62015A Nondisplaced fracture of distal pole of navicular [scaphoid] bone of left wrist, initial encounter for closed fracture: Secondary | ICD-10-CM | POA: Insufficient documentation

## 2015-05-19 DIAGNOSIS — Z7984 Long term (current) use of oral hypoglycemic drugs: Secondary | ICD-10-CM | POA: Insufficient documentation

## 2015-05-19 DIAGNOSIS — S81812A Laceration without foreign body, left lower leg, initial encounter: Secondary | ICD-10-CM | POA: Diagnosis not present

## 2015-05-19 DIAGNOSIS — W1839XA Other fall on same level, initial encounter: Secondary | ICD-10-CM | POA: Insufficient documentation

## 2015-05-19 DIAGNOSIS — E119 Type 2 diabetes mellitus without complications: Secondary | ICD-10-CM | POA: Insufficient documentation

## 2015-05-19 DIAGNOSIS — S80812A Abrasion, left lower leg, initial encounter: Secondary | ICD-10-CM

## 2015-05-19 DIAGNOSIS — Y92512 Supermarket, store or market as the place of occurrence of the external cause: Secondary | ICD-10-CM | POA: Insufficient documentation

## 2015-05-19 DIAGNOSIS — Y998 Other external cause status: Secondary | ICD-10-CM | POA: Diagnosis not present

## 2015-05-19 DIAGNOSIS — S60212A Contusion of left wrist, initial encounter: Secondary | ICD-10-CM | POA: Insufficient documentation

## 2015-05-19 DIAGNOSIS — Z79899 Other long term (current) drug therapy: Secondary | ICD-10-CM | POA: Diagnosis not present

## 2015-05-19 DIAGNOSIS — S6992XA Unspecified injury of left wrist, hand and finger(s), initial encounter: Secondary | ICD-10-CM | POA: Diagnosis present

## 2015-05-19 MED ORDER — TETANUS-DIPHTH-ACELL PERTUSSIS 5-2.5-18.5 LF-MCG/0.5 IM SUSP
0.5000 mL | Freq: Once | INTRAMUSCULAR | Status: AC
  Administered 2015-05-19: 0.5 mL via INTRAMUSCULAR
  Filled 2015-05-19: qty 0.5

## 2015-05-19 NOTE — Discharge Instructions (Signed)
Abrasion An abrasion is a cut or scrape on the outer surface of your skin. An abrasion does not extend through all of the layers of your skin. It is important to care for your abrasion properly to prevent infection. CAUSES Most abrasions are caused by falling on or gliding across the ground or another surface. When your skin rubs on something, the outer and inner layer of skin rubs off.  SYMPTOMS A cut or scrape is the main symptom of this condition. The scrape may be bleeding, or it may appear red or pink. If there was an associated fall, there may be an underlying bruise. DIAGNOSIS An abrasion is diagnosed with a physical exam. TREATMENT Treatment for this condition depends on how large and deep the abrasion is. Usually, your abrasion will be cleaned with water and mild soap. This removes any dirt or debris that may be stuck. An antibiotic ointment may be applied to the abrasion to help prevent infection. A bandage (dressing) may be placed on the abrasion to keep it clean. You may also need a tetanus shot. HOME CARE INSTRUCTIONS Medicines  Take or apply medicines only as directed by your health care provider.  If you were prescribed an antibiotic ointment, finish all of it even if you start to feel better. Wound Care  Clean the wound with mild soap and water 2-3 times per day or as directed by your health care provider. Pat your wound dry with a clean towel. Do not rub it.  There are many different ways to close and cover a wound. Follow instructions from your health care provider about:  Wound care.  Dressing changes and removal.  Check your wound every day for signs of infection. Watch for:  Redness, swelling, or pain.  Fluid, blood, or pus. General Instructions  Keep the dressing dry as directed by your health care provider. Do not take baths, swim, use a hot tub, or do anything that would put your wound underwater until your health care provider approves.  If there is  swelling, raise (elevate) the injured area above the level of your heart while you are sitting or lying down.  Keep all follow-up visits as directed by your health care provider. This is important. SEEK MEDICAL CARE IF:  You received a tetanus shot and you have swelling, severe pain, redness, or bleeding at the injection site.  Your pain is not controlled with medicine.  You have increased redness, swelling, or pain at the site of your wound. SEEK IMMEDIATE MEDICAL CARE IF:  You have a red streak going away from your wound.  You have a fever.  You have fluid, blood, or pus coming from your wound.  You notice a bad smell coming from your wound or your dressing.   This information is not intended to replace advice given to you by your health care provider. Make sure you discuss any questions you have with your health care provider.   Document Released: 03/19/2005 Document Revised: 02/28/2015 Document Reviewed: 06/07/2014 Elsevier Interactive Patient Education 2016 [employer] or Splint Care Casts and splints support injured limbs and keep bones from moving while they heal. It is important to care for your cast or splint at home.  HOME CARE INSTRUCTIONS  Keep the cast or splint uncovered during the drying period. It can take 24 to 48 hours to dry if it is made of plaster. A fiberglass cast will dry in less than 1 hour.  Do not rest the cast on  anything harder than a pillow for the first 24 hours.  Do not put weight on your injured limb or apply pressure to the cast until your health care provider gives you permission.  Keep the cast or splint dry. Wet casts or splints can lose their shape and may not support the limb as well. A wet cast that has lost its shape can also create harmful pressure on your skin when it dries. Also, wet skin can become infected.  Cover the cast or splint with a plastic bag when bathing or when out in the rain or snow. If the cast is on the trunk  of the body, take sponge baths until the cast is removed.  If your cast does become wet, dry it with a towel or a blow dryer on the cool setting only.  Keep your cast or splint clean. Soiled casts may be wiped with a moistened cloth.  Do not place any hard or soft foreign objects under your cast or splint, such as cotton, toilet paper, lotion, or powder.  Do not try to scratch the skin under the cast with any object. The object could get stuck inside the cast. Also, scratching could lead to an infection. If itching is a problem, use a blow dryer on a cool setting to relieve discomfort.  Do not trim or cut your cast or remove padding from inside of it.  Exercise all joints next to the injury that are not immobilized by the cast or splint. For example, if you have a long leg cast, exercise the hip joint and toes. If you have an arm cast or splint, exercise the shoulder, elbow, thumb, and fingers.  Elevate your injured arm or leg on 1 or 2 pillows for the first 1 to 3 days to decrease swelling and pain.It is best if you can comfortably elevate your cast so it is higher than your heart. SEEK MEDICAL CARE IF:   Your cast or splint cracks.  Your cast or splint is too tight or too loose.  You have unbearable itching inside the cast.  Your cast becomes wet or develops a soft spot or area.  You have a bad smell coming from inside your cast.  You get an object stuck under your cast.  Your skin around the cast becomes red or raw.  You have new pain or worsening pain after the cast has been applied. SEEK IMMEDIATE MEDICAL CARE IF:   You have fluid leaking through the cast.  You are unable to move your fingers or toes.  You have discolored (blue or white), cool, painful, or very swollen fingers or toes beyond the cast.  You have tingling or numbness around the injured area.  You have severe pain or pressure under the cast.  You have any difficulty with your breathing or have shortness  of breath.  You have chest pain.   This information is not intended to replace advice given to you by your health care provider. Make sure you discuss any questions you have with your health care provider.   Document Released: 06/06/2000 Document Revised: 03/30/2013 Document Reviewed: 12/16/2012 Elsevier Interactive Patient Education 2016 [employer]  Scaphoid Fracture, Wrist A fracture is a break in the bone. The bone you have broken often does not show up as a fracture on x-ray until later on in the healing phase. This bone is called the scaphoid bone. With this bone, your caregiver will often cast or splint your wrist as though it is  fractured, even if a fracture is not seen on the x-ray. This is often done with wrist injuries in which there is tenderness at the base of the thumb. An x-ray at 1-3 weeks after your injury may confirm this fracture. A cast or splint is used to protect and keep your injured bone in good position for healing. The cast or splint will be on generally for about 6 to 16 weeks, depending on your health, age, the fracture location and how quickly you heal. Another name for the scaphoid bone is the navicular bone. HOME CARE INSTRUCTIONS   To lessen the swelling and pain, keep the injured part elevated above your heart while sitting or lying down.  Apply ice to the injury for 15-20 minutes, 03-04 times per day while awake, for 2 days. Put the ice in a plastic bag and place a thin towel between the bag of ice and your cast.  If you have a plaster or fiberglass cast or splint:  Do not try to scratch the skin under the cast using sharp or pointed objects.  Check the skin around the cast every day. You may put lotion on any red or sore areas.  Keep your cast or splint dry and clean.  If you have a plaster splint:  Wear the splint as directed.  You may loosen the elastic bandage around the splint if your fingers become numb, tingle, or turn cold or blue.  If you  have been put in a removable splint, wear and use as directed.  Do not put pressure on any part of your cast or splint; it may deform or break. Rest your cast or splint only on a pillow the first 24 hours until it is fully hardened.  Your cast or splint can be protected during bathing with a plastic bag. Do not lower the cast or splint into water.  Only take over-the-counter or prescription medicines for pain, discomfort, or fever as directed by your caregiver.  If your caregiver has given you a follow up appointment, it is very important to keep that appointment. Not keeping the appointment could result in chronic pain and decreased function. If there is any problem keeping the appointment, you must call back to this facility for assistance. SEEK IMMEDIATE MEDICAL CARE IF:   Your cast gets damaged, wet or breaks.  You have continued severe pain or more swelling than you did before the cast or splint was put on.  Your skin or nails below the injury turn blue or gray, or feel cold or numb.  You have tingling or burning pain in your fingers or increasing pain with movement of your fingers   This information is not intended to replace advice given to you by your health care provider. Make sure you discuss any questions you have with your health care provider.   Document Released: 05/30/2002 Document Revised: 09/01/2011 Document Reviewed: 12/20/2014 Elsevier Interactive Patient Education [employer].

## 2015-05-19 NOTE — ED Notes (Signed)
Mechanical fall

## 2015-05-19 NOTE — ED Notes (Signed)
Splint applied to left wrist. Patient tolerated well.

## 2015-05-19 NOTE — ED Provider Notes (Signed)
[facility] Emergency Department Provider Note  ____________________________________________  Time seen: Approximately 1:37 PM  I have reviewed the triage vital signs and the nursing notes.   HISTORY  Chief Complaint Arm Pain    HPI Tiffany Hunt is a 74 y.o. female who presents to the emergency department status post a fall today. She states that she was going through an antique store when her foot caught and caused her to fall landing on the left side. She endorses pain to the left wrist, left shin pain, and abrasion/laceration to left chin. She denies striking her head or losing consciousness. She does have a history of ORIF distal radial fracture to the lef wrist approximately 2 years ago. She denies any numbness or tingling to digits. She is not on blood thinners and was able control bleeding of the abrasion prior to arrival. Last tetanus was over 5 years ago.   Past Medical History  Diagnosis Date  . Diabetes mellitus without complication ([place])   . Hypertension   . History of breast cancer     Patient Active Problem List   Diagnosis Date Noted  . Closed fracture of left distal radius and ulna 07/27/2013    Class: Acute    Past Surgical History  Procedure Laterality Date  . Breast lumpectomy Left   . Dilation and curettage of uterus    . Open reduction internal fixation (orif) distal radial fracture Left 07/27/2013    Procedure: OPEN REDUCTION INTERNAL FIXATION (ORIF) DISTAL RADIAL FRACTURE;  Surgeon: Marybelle Killings, MD;  Location: [facility];  Service: Orthopedics;  Laterality: Left;  Open Reduction Internal Fixation Left Distal Radius    Current Outpatient Rx  Name  Route  Sig  Dispense  Refill  . amLODipine (NORVASC) 5 MG tablet   Oral   Take 5 mg by mouth daily.         Marland Kitchen atorvastatin (LIPITOR) 20 MG tablet   Oral   Take 20 mg by mouth daily.         . benazepril-hydrochlorthiazide (LOTENSIN HCT) 10-12.5 MG per tablet   Oral   Take 1  tablet by mouth daily.         . cholecalciferol (VITAMIN D) 1000 UNITS tablet   Oral   Take 2,000 Units by mouth daily.         Marland Kitchen HYDROcodone-acetaminophen (NORCO) 5-325 MG per tablet   Oral   Take 1-2 tablets by mouth every 4 (four) hours as needed for moderate pain.   30 tablet   0   . HYDROcodone-acetaminophen (NORCO/VICODIN) 5-325 MG per tablet   Oral   Take 0.5 tablets by mouth every 4 (four) hours as needed for moderate pain.         Marland Kitchen loratadine (CLARITIN) 10 MG tablet   Oral   Take 10 mg by mouth daily as needed for allergies.         . metFORMIN (GLUCOPHAGE) 500 MG tablet   Oral   Take 500 mg by mouth 2 (two) times daily with a meal.           Allergies Lidocaine-epinephrine  No family history on file.  Social History Social History  Substance Use Topics  . Smoking status: Never Smoker   . Smokeless tobacco: None  . Alcohol Use: No    Review of Systems Constitutional: No fever/chills Eyes: No visual changes. ENT: No sore throat. Cardiovascular: Denies chest pain. Respiratory: Denies shortness of breath. Gastrointestinal: No abdominal pain.  No nausea, no vomiting.  No diarrhea.  No constipation. Genitourinary: Negative for dysuria. Musculoskeletal: Negative for back pain. Endorses left wrist pain. Skin: Negative for rash. Endorses abrasion/laceration to left shin. Neurological: Negative for headaches, focal weakness or numbness.  10-point ROS otherwise negative.  ____________________________________________   PHYSICAL EXAM:  VITAL SIGNS: ED Triage Vitals  Enc Vitals Group     BP 05/19/15 1234 169/80 mmHg     Pulse Rate 05/19/15 1234 102     Resp 05/19/15 1234 20     Temp 05/19/15 1234 97.9 F (36.6 C)     Temp Source 05/19/15 1234 Oral     SpO2 05/19/15 1234 99 %     Weight 05/19/15 1234 130 lb (58.968 kg)     Height 05/19/15 1234 5\' 3"  (1.6 m)     Head Cir --      Peak Flow --      Pain Score 05/19/15 1235 2     Pain Loc --       Pain Edu? --      Excl. in [place]? --     Constitutional: Alert and oriented. Well appearing and in no acute distress. Eyes: Conjunctivae are normal. PERRL. EOMI. Head: Atraumatic. Nose: No congestion/rhinnorhea. Mouth/Throat: Mucous membranes are moist.  Oropharynx non-erythematous. Neck: No stridor.   Cardiovascular: Normal rate, regular rhythm. Grossly normal heart sounds.  Good peripheral circulation. Respiratory: Normal respiratory effort.  No retractions. Lungs CTAB. Gastrointestinal: Soft and nontender. No distention. No abdominal bruits. No CVA tenderness. Musculoskeletal: No lower extremity tenderness nor edema.  No joint effusions. edema noted to the distal left wrist when compared with the right. Hematoma is noted to the lateral aspect. Diffuse tenderness to palpation over distal radius and carpal bones. Range of motion of all digits. Sensation and cap refill present in all digits. Neurologic:  Normal speech and language. No gross focal neurologic deficits are appreciated. No gait instability. Skin:  Skin is warm, dry and intact. No rash noted. abrasion/skin tear noted to left shin. Psychiatric: Mood and affect are normal. Speech and behavior are normal.  ____________________________________________   LABS (all labs ordered are listed, but only abnormal results are displayed)  Labs Reviewed - No data to display ____________________________________________  EKG   ____________________________________________  RADIOLOGY  left wrist x-ray Impression: Nondisplaced left scaphoid fracture. No evidence of hardware, location involving the left distal radius surgical plate and screws. ____________________________________________   PROCEDURES  Procedure(s) performed: Yes, wound care and splint placement, see procedure note(s).   The wound is cleansed, debrided of foreign material as much as possible, and dressed. The patient is alerted to watch for any signs of infection  (redness, pus, pain, increased swelling or fever) and call if such occurs. Home wound care instructions are provided. Tetanus vaccination status reviewed: Td vaccination indicated and given today.  SPLINT APPLICATION Date/Time: 123XX123 PM Authorized by: Darletta Moll Consent: Verbal consent obtained. Risks and benefits: risks, benefits and alternatives were discussed Consent given by: patient Splint applied by: orthopedic technician Location details: Left wrist  Splint type: Thumb spica  Supplies used: Ortho-Glass  Post-procedure: The splinted body part was neurovascularly unchanged following the procedure. Patient tolerance: Patient tolerated the procedure well with no immediate complications.     Critical Care performed: No  ____________________________________________   INITIAL IMPRESSION / ASSESSMENT AND PLAN / ED COURSE  Pertinent labs & imaging results that were available during my care of the patient were reviewed by me and considered in my medical  decision making (see chart for details).  Patient's history, symptoms, physical exam, regular findings of taking into consideration for diagnosis. Patient has a abrasion to the left shin which is cleansed and bandaged emergency department. Tetanus booster is administered. Patient also has a fracture to the left scaphoid which is nondisplaced. Thumb spica splint is applied here in the emergency department. Patient is to follow-up with her orthopedic surgeon in [facility] for follow-up. Patient verbalizes understanding of the diagnosis and treatment plan and verbalizes compliance with same. ____________________________________________   FINAL CLINICAL IMPRESSION(S) / ED DIAGNOSES  Final diagnoses:  Scaphoid fracture of wrist, left, closed, initial encounter  Abrasion of leg, left, initial encounter      Darletta Moll, PA-C 05/19/15 [place], MD 05/20/15 2056

## 2015-10-03 ENCOUNTER — Other Ambulatory Visit: Payer: Self-pay

## 2015-10-03 DIAGNOSIS — Z1231 Encounter for screening mammogram for malignant neoplasm of breast: Secondary | ICD-10-CM

## 2015-11-01 ENCOUNTER — Ambulatory Visit
Admission: RE | Admit: 2015-11-01 | Discharge: 2015-11-01 | Disposition: A | Discharge status: Home / Self Care | Payer: Medicare Other | Source: Ambulatory Visit

## 2015-11-01 DIAGNOSIS — Z1231 Encounter for screening mammogram for malignant neoplasm of breast: Secondary | ICD-10-CM

## 2016-10-01 ENCOUNTER — Other Ambulatory Visit: Payer: Self-pay | Admitting: Internal Medicine

## 2016-10-01 DIAGNOSIS — Z1231 Encounter for screening mammogram for malignant neoplasm of breast: Secondary | ICD-10-CM

## 2016-11-03 ENCOUNTER — Ambulatory Visit
Admission: RE | Admit: 2016-11-03 | Discharge: 2016-11-03 | Disposition: A | Discharge status: Home / Self Care | Payer: Medicare Other | Source: Ambulatory Visit | Attending: Internal Medicine | Admitting: Internal Medicine

## 2016-11-03 DIAGNOSIS — Z1231 Encounter for screening mammogram for malignant neoplasm of breast: Secondary | ICD-10-CM

## 2016-11-03 HISTORY — DX: Personal history of irradiation: Z92.3

## 2017-08-31 DIAGNOSIS — I10 Essential (primary) hypertension: Secondary | ICD-10-CM | POA: Diagnosis not present

## 2017-08-31 DIAGNOSIS — E119 Type 2 diabetes mellitus without complications: Secondary | ICD-10-CM | POA: Diagnosis not present

## 2017-08-31 DIAGNOSIS — Z7984 Long term (current) use of oral hypoglycemic drugs: Secondary | ICD-10-CM | POA: Diagnosis not present

## 2017-08-31 DIAGNOSIS — H6121 Impacted cerumen, right ear: Secondary | ICD-10-CM | POA: Diagnosis not present

## 2017-09-02 DIAGNOSIS — L738 Other specified follicular disorders: Secondary | ICD-10-CM | POA: Diagnosis not present

## 2017-09-02 DIAGNOSIS — D225 Melanocytic nevi of trunk: Secondary | ICD-10-CM | POA: Diagnosis not present

## 2017-09-02 DIAGNOSIS — D692 Other nonthrombocytopenic purpura: Secondary | ICD-10-CM | POA: Diagnosis not present

## 2017-09-02 DIAGNOSIS — Z85828 Personal history of other malignant neoplasm of skin: Secondary | ICD-10-CM | POA: Diagnosis not present

## 2017-09-02 DIAGNOSIS — L570 Actinic keratosis: Secondary | ICD-10-CM | POA: Diagnosis not present

## 2017-09-02 DIAGNOSIS — L814 Other melanin hyperpigmentation: Secondary | ICD-10-CM | POA: Diagnosis not present

## 2017-09-02 DIAGNOSIS — L821 Other seborrheic keratosis: Secondary | ICD-10-CM | POA: Diagnosis not present

## 2017-10-06 ENCOUNTER — Other Ambulatory Visit: Payer: Self-pay | Admitting: Internal Medicine

## 2017-10-06 DIAGNOSIS — Z1231 Encounter for screening mammogram for malignant neoplasm of breast: Secondary | ICD-10-CM

## 2017-11-04 ENCOUNTER — Ambulatory Visit
Admission: RE | Admit: 2017-11-04 | Discharge: 2017-11-04 | Disposition: A | Discharge status: Home / Self Care | Payer: Medicare Other | Source: Ambulatory Visit | Attending: Internal Medicine | Admitting: Internal Medicine

## 2017-11-04 DIAGNOSIS — Z1231 Encounter for screening mammogram for malignant neoplasm of breast: Secondary | ICD-10-CM

## 2017-11-19 DIAGNOSIS — M810 Age-related osteoporosis without current pathological fracture: Secondary | ICD-10-CM | POA: Diagnosis not present

## 2017-11-19 DIAGNOSIS — E119 Type 2 diabetes mellitus without complications: Secondary | ICD-10-CM | POA: Diagnosis not present

## 2017-11-19 DIAGNOSIS — E1165 Type 2 diabetes mellitus with hyperglycemia: Secondary | ICD-10-CM | POA: Diagnosis not present

## 2017-11-19 DIAGNOSIS — I10 Essential (primary) hypertension: Secondary | ICD-10-CM | POA: Diagnosis not present

## 2017-12-28 DIAGNOSIS — I10 Essential (primary) hypertension: Secondary | ICD-10-CM | POA: Diagnosis not present

## 2017-12-28 DIAGNOSIS — E1165 Type 2 diabetes mellitus with hyperglycemia: Secondary | ICD-10-CM | POA: Diagnosis not present

## 2017-12-28 DIAGNOSIS — Z7984 Long term (current) use of oral hypoglycemic drugs: Secondary | ICD-10-CM | POA: Diagnosis not present

## 2017-12-28 DIAGNOSIS — E7800 Pure hypercholesterolemia, unspecified: Secondary | ICD-10-CM | POA: Diagnosis not present

## 2017-12-28 DIAGNOSIS — R197 Diarrhea, unspecified: Secondary | ICD-10-CM | POA: Diagnosis not present

## 2018-01-04 DIAGNOSIS — E119 Type 2 diabetes mellitus without complications: Secondary | ICD-10-CM | POA: Diagnosis not present

## 2018-01-04 DIAGNOSIS — M810 Age-related osteoporosis without current pathological fracture: Secondary | ICD-10-CM | POA: Diagnosis not present

## 2018-01-04 DIAGNOSIS — Z7984 Long term (current) use of oral hypoglycemic drugs: Secondary | ICD-10-CM | POA: Diagnosis not present

## 2018-01-04 DIAGNOSIS — E1165 Type 2 diabetes mellitus with hyperglycemia: Secondary | ICD-10-CM | POA: Diagnosis not present

## 2018-01-04 DIAGNOSIS — I10 Essential (primary) hypertension: Secondary | ICD-10-CM | POA: Diagnosis not present

## 2018-01-19 DIAGNOSIS — H53002 Unspecified amblyopia, left eye: Secondary | ICD-10-CM | POA: Diagnosis not present

## 2018-01-19 DIAGNOSIS — H2513 Age-related nuclear cataract, bilateral: Secondary | ICD-10-CM | POA: Diagnosis not present

## 2018-01-19 DIAGNOSIS — H5203 Hypermetropia, bilateral: Secondary | ICD-10-CM | POA: Diagnosis not present

## 2018-01-19 DIAGNOSIS — E119 Type 2 diabetes mellitus without complications: Secondary | ICD-10-CM | POA: Diagnosis not present

## 2018-02-02 DIAGNOSIS — Z7984 Long term (current) use of oral hypoglycemic drugs: Secondary | ICD-10-CM | POA: Diagnosis not present

## 2018-02-02 DIAGNOSIS — M810 Age-related osteoporosis without current pathological fracture: Secondary | ICD-10-CM | POA: Diagnosis not present

## 2018-02-02 DIAGNOSIS — E119 Type 2 diabetes mellitus without complications: Secondary | ICD-10-CM | POA: Diagnosis not present

## 2018-02-02 DIAGNOSIS — I10 Essential (primary) hypertension: Secondary | ICD-10-CM | POA: Diagnosis not present

## 2018-02-02 DIAGNOSIS — E1165 Type 2 diabetes mellitus with hyperglycemia: Secondary | ICD-10-CM | POA: Diagnosis not present

## 2018-03-08 DIAGNOSIS — Z79899 Other long term (current) drug therapy: Secondary | ICD-10-CM | POA: Diagnosis not present

## 2018-03-08 DIAGNOSIS — E559 Vitamin D deficiency, unspecified: Secondary | ICD-10-CM | POA: Diagnosis not present

## 2018-03-08 DIAGNOSIS — E1165 Type 2 diabetes mellitus with hyperglycemia: Secondary | ICD-10-CM | POA: Diagnosis not present

## 2018-03-08 DIAGNOSIS — Z23 Encounter for immunization: Secondary | ICD-10-CM | POA: Diagnosis not present

## 2018-03-08 DIAGNOSIS — M810 Age-related osteoporosis without current pathological fracture: Secondary | ICD-10-CM | POA: Diagnosis not present

## 2018-03-08 DIAGNOSIS — I10 Essential (primary) hypertension: Secondary | ICD-10-CM | POA: Diagnosis not present

## 2018-03-08 DIAGNOSIS — R197 Diarrhea, unspecified: Secondary | ICD-10-CM | POA: Diagnosis not present

## 2018-03-08 DIAGNOSIS — Z0001 Encounter for general adult medical examination with abnormal findings: Secondary | ICD-10-CM | POA: Diagnosis not present

## 2018-09-06 DIAGNOSIS — I10 Essential (primary) hypertension: Secondary | ICD-10-CM | POA: Diagnosis not present

## 2018-09-06 DIAGNOSIS — E119 Type 2 diabetes mellitus without complications: Secondary | ICD-10-CM | POA: Diagnosis not present

## 2018-09-06 DIAGNOSIS — M810 Age-related osteoporosis without current pathological fracture: Secondary | ICD-10-CM | POA: Diagnosis not present

## 2018-09-06 DIAGNOSIS — E1165 Type 2 diabetes mellitus with hyperglycemia: Secondary | ICD-10-CM | POA: Diagnosis not present

## 2018-10-12 DIAGNOSIS — M810 Age-related osteoporosis without current pathological fracture: Secondary | ICD-10-CM | POA: Diagnosis not present

## 2018-10-12 DIAGNOSIS — E7800 Pure hypercholesterolemia, unspecified: Secondary | ICD-10-CM | POA: Diagnosis not present

## 2018-10-12 DIAGNOSIS — I10 Essential (primary) hypertension: Secondary | ICD-10-CM | POA: Diagnosis not present

## 2018-10-12 DIAGNOSIS — E119 Type 2 diabetes mellitus without complications: Secondary | ICD-10-CM | POA: Diagnosis not present

## 2018-10-12 DIAGNOSIS — E1165 Type 2 diabetes mellitus with hyperglycemia: Secondary | ICD-10-CM | POA: Diagnosis not present

## 2018-12-09 DIAGNOSIS — E7800 Pure hypercholesterolemia, unspecified: Secondary | ICD-10-CM | POA: Diagnosis not present

## 2018-12-09 DIAGNOSIS — E1165 Type 2 diabetes mellitus with hyperglycemia: Secondary | ICD-10-CM | POA: Diagnosis not present

## 2018-12-09 DIAGNOSIS — M810 Age-related osteoporosis without current pathological fracture: Secondary | ICD-10-CM | POA: Diagnosis not present

## 2018-12-09 DIAGNOSIS — I10 Essential (primary) hypertension: Secondary | ICD-10-CM | POA: Diagnosis not present

## 2018-12-09 DIAGNOSIS — E119 Type 2 diabetes mellitus without complications: Secondary | ICD-10-CM | POA: Diagnosis not present

## 2018-12-27 ENCOUNTER — Other Ambulatory Visit: Payer: Self-pay | Admitting: Internal Medicine

## 2018-12-27 DIAGNOSIS — Z1231 Encounter for screening mammogram for malignant neoplasm of breast: Secondary | ICD-10-CM

## 2019-01-07 ENCOUNTER — Ambulatory Visit
Admission: RE | Admit: 2019-01-07 | Discharge: 2019-01-07 | Disposition: A | Discharge status: Home / Self Care | Payer: Medicare HMO | Source: Ambulatory Visit | Attending: Internal Medicine | Admitting: Internal Medicine

## 2019-01-07 ENCOUNTER — Other Ambulatory Visit: Payer: Self-pay

## 2019-01-07 DIAGNOSIS — Z1231 Encounter for screening mammogram for malignant neoplasm of breast: Secondary | ICD-10-CM | POA: Diagnosis not present

## 2019-01-13 DIAGNOSIS — E119 Type 2 diabetes mellitus without complications: Secondary | ICD-10-CM | POA: Diagnosis not present

## 2019-01-13 DIAGNOSIS — M810 Age-related osteoporosis without current pathological fracture: Secondary | ICD-10-CM | POA: Diagnosis not present

## 2019-01-13 DIAGNOSIS — I10 Essential (primary) hypertension: Secondary | ICD-10-CM | POA: Diagnosis not present

## 2019-01-13 DIAGNOSIS — E1165 Type 2 diabetes mellitus with hyperglycemia: Secondary | ICD-10-CM | POA: Diagnosis not present

## 2019-01-13 DIAGNOSIS — E7800 Pure hypercholesterolemia, unspecified: Secondary | ICD-10-CM | POA: Diagnosis not present

## 2019-01-28 DIAGNOSIS — E7800 Pure hypercholesterolemia, unspecified: Secondary | ICD-10-CM | POA: Diagnosis not present

## 2019-01-28 DIAGNOSIS — E1165 Type 2 diabetes mellitus with hyperglycemia: Secondary | ICD-10-CM | POA: Diagnosis not present

## 2019-01-28 DIAGNOSIS — E119 Type 2 diabetes mellitus without complications: Secondary | ICD-10-CM | POA: Diagnosis not present

## 2019-01-28 DIAGNOSIS — M810 Age-related osteoporosis without current pathological fracture: Secondary | ICD-10-CM | POA: Diagnosis not present

## 2019-01-28 DIAGNOSIS — I10 Essential (primary) hypertension: Secondary | ICD-10-CM | POA: Diagnosis not present

## 2019-02-21 DIAGNOSIS — E119 Type 2 diabetes mellitus without complications: Secondary | ICD-10-CM | POA: Diagnosis not present

## 2019-02-21 DIAGNOSIS — E1165 Type 2 diabetes mellitus with hyperglycemia: Secondary | ICD-10-CM | POA: Diagnosis not present

## 2019-02-21 DIAGNOSIS — I10 Essential (primary) hypertension: Secondary | ICD-10-CM | POA: Diagnosis not present

## 2019-02-21 DIAGNOSIS — E7800 Pure hypercholesterolemia, unspecified: Secondary | ICD-10-CM | POA: Diagnosis not present

## 2019-02-21 DIAGNOSIS — M810 Age-related osteoporosis without current pathological fracture: Secondary | ICD-10-CM | POA: Diagnosis not present

## 2019-03-11 DIAGNOSIS — R69 Illness, unspecified: Secondary | ICD-10-CM | POA: Diagnosis not present

## 2019-03-31 DIAGNOSIS — Z1389 Encounter for screening for other disorder: Secondary | ICD-10-CM | POA: Diagnosis not present

## 2019-03-31 DIAGNOSIS — Z0001 Encounter for general adult medical examination with abnormal findings: Secondary | ICD-10-CM | POA: Diagnosis not present

## 2019-03-31 DIAGNOSIS — E1165 Type 2 diabetes mellitus with hyperglycemia: Secondary | ICD-10-CM | POA: Diagnosis not present

## 2019-03-31 DIAGNOSIS — E7800 Pure hypercholesterolemia, unspecified: Secondary | ICD-10-CM | POA: Diagnosis not present

## 2019-03-31 DIAGNOSIS — R197 Diarrhea, unspecified: Secondary | ICD-10-CM | POA: Diagnosis not present

## 2019-03-31 DIAGNOSIS — E559 Vitamin D deficiency, unspecified: Secondary | ICD-10-CM | POA: Diagnosis not present

## 2019-03-31 DIAGNOSIS — I10 Essential (primary) hypertension: Secondary | ICD-10-CM | POA: Diagnosis not present

## 2019-03-31 DIAGNOSIS — J301 Allergic rhinitis due to pollen: Secondary | ICD-10-CM | POA: Diagnosis not present

## 2019-03-31 DIAGNOSIS — M810 Age-related osteoporosis without current pathological fracture: Secondary | ICD-10-CM | POA: Diagnosis not present

## 2019-03-31 DIAGNOSIS — Z79899 Other long term (current) drug therapy: Secondary | ICD-10-CM | POA: Diagnosis not present

## 2019-05-24 DIAGNOSIS — D692 Other nonthrombocytopenic purpura: Secondary | ICD-10-CM | POA: Diagnosis not present

## 2019-05-24 DIAGNOSIS — D225 Melanocytic nevi of trunk: Secondary | ICD-10-CM | POA: Diagnosis not present

## 2019-05-24 DIAGNOSIS — C44612 Basal cell carcinoma of skin of right upper limb, including shoulder: Secondary | ICD-10-CM | POA: Diagnosis not present

## 2019-05-24 DIAGNOSIS — C4441 Basal cell carcinoma of skin of scalp and neck: Secondary | ICD-10-CM | POA: Diagnosis not present

## 2019-05-24 DIAGNOSIS — Z85828 Personal history of other malignant neoplasm of skin: Secondary | ICD-10-CM | POA: Diagnosis not present

## 2019-05-24 DIAGNOSIS — L570 Actinic keratosis: Secondary | ICD-10-CM | POA: Diagnosis not present

## 2019-05-24 DIAGNOSIS — L821 Other seborrheic keratosis: Secondary | ICD-10-CM | POA: Diagnosis not present

## 2019-05-24 DIAGNOSIS — D485 Neoplasm of uncertain behavior of skin: Secondary | ICD-10-CM | POA: Diagnosis not present

## 2019-06-14 DIAGNOSIS — Z20828 Contact with and (suspected) exposure to other viral communicable diseases: Secondary | ICD-10-CM | POA: Diagnosis not present

## 2019-06-23 DIAGNOSIS — E119 Type 2 diabetes mellitus without complications: Secondary | ICD-10-CM | POA: Diagnosis not present

## 2019-06-23 DIAGNOSIS — I10 Essential (primary) hypertension: Secondary | ICD-10-CM | POA: Diagnosis not present

## 2019-06-23 DIAGNOSIS — E1165 Type 2 diabetes mellitus with hyperglycemia: Secondary | ICD-10-CM | POA: Diagnosis not present

## 2019-06-23 DIAGNOSIS — E7800 Pure hypercholesterolemia, unspecified: Secondary | ICD-10-CM | POA: Diagnosis not present

## 2019-06-23 DIAGNOSIS — M810 Age-related osteoporosis without current pathological fracture: Secondary | ICD-10-CM | POA: Diagnosis not present

## 2019-06-30 DIAGNOSIS — Z85828 Personal history of other malignant neoplasm of skin: Secondary | ICD-10-CM | POA: Diagnosis not present

## 2019-06-30 DIAGNOSIS — C4441 Basal cell carcinoma of skin of scalp and neck: Secondary | ICD-10-CM | POA: Diagnosis not present

## 2019-07-20 DIAGNOSIS — E7800 Pure hypercholesterolemia, unspecified: Secondary | ICD-10-CM | POA: Diagnosis not present

## 2019-07-20 DIAGNOSIS — M810 Age-related osteoporosis without current pathological fracture: Secondary | ICD-10-CM | POA: Diagnosis not present

## 2019-07-20 DIAGNOSIS — E119 Type 2 diabetes mellitus without complications: Secondary | ICD-10-CM | POA: Diagnosis not present

## 2019-07-20 DIAGNOSIS — E1165 Type 2 diabetes mellitus with hyperglycemia: Secondary | ICD-10-CM | POA: Diagnosis not present

## 2019-07-20 DIAGNOSIS — I10 Essential (primary) hypertension: Secondary | ICD-10-CM | POA: Diagnosis not present

## 2019-08-09 DIAGNOSIS — M810 Age-related osteoporosis without current pathological fracture: Secondary | ICD-10-CM | POA: Diagnosis not present

## 2019-08-09 DIAGNOSIS — E119 Type 2 diabetes mellitus without complications: Secondary | ICD-10-CM | POA: Diagnosis not present

## 2019-08-09 DIAGNOSIS — E7800 Pure hypercholesterolemia, unspecified: Secondary | ICD-10-CM | POA: Diagnosis not present

## 2019-08-09 DIAGNOSIS — E1165 Type 2 diabetes mellitus with hyperglycemia: Secondary | ICD-10-CM | POA: Diagnosis not present

## 2019-08-09 DIAGNOSIS — I10 Essential (primary) hypertension: Secondary | ICD-10-CM | POA: Diagnosis not present

## 2019-08-12 DIAGNOSIS — H5203 Hypermetropia, bilateral: Secondary | ICD-10-CM | POA: Diagnosis not present

## 2019-08-12 DIAGNOSIS — E119 Type 2 diabetes mellitus without complications: Secondary | ICD-10-CM | POA: Diagnosis not present

## 2019-08-12 DIAGNOSIS — H25813 Combined forms of age-related cataract, bilateral: Secondary | ICD-10-CM | POA: Diagnosis not present

## 2019-08-21 DIAGNOSIS — M810 Age-related osteoporosis without current pathological fracture: Secondary | ICD-10-CM | POA: Diagnosis not present

## 2019-08-21 DIAGNOSIS — E119 Type 2 diabetes mellitus without complications: Secondary | ICD-10-CM | POA: Diagnosis not present

## 2019-08-21 DIAGNOSIS — E7800 Pure hypercholesterolemia, unspecified: Secondary | ICD-10-CM | POA: Diagnosis not present

## 2019-08-21 DIAGNOSIS — I10 Essential (primary) hypertension: Secondary | ICD-10-CM | POA: Diagnosis not present

## 2019-08-21 DIAGNOSIS — E1165 Type 2 diabetes mellitus with hyperglycemia: Secondary | ICD-10-CM | POA: Diagnosis not present

## 2019-08-23 DIAGNOSIS — Z85828 Personal history of other malignant neoplasm of skin: Secondary | ICD-10-CM | POA: Diagnosis not present

## 2019-08-23 DIAGNOSIS — D485 Neoplasm of uncertain behavior of skin: Secondary | ICD-10-CM | POA: Diagnosis not present

## 2019-08-23 DIAGNOSIS — L720 Epidermal cyst: Secondary | ICD-10-CM | POA: Diagnosis not present

## 2019-08-23 DIAGNOSIS — L570 Actinic keratosis: Secondary | ICD-10-CM | POA: Diagnosis not present

## 2019-08-23 DIAGNOSIS — S70351A Superficial foreign body, right thigh, initial encounter: Secondary | ICD-10-CM | POA: Diagnosis not present

## 2019-09-06 DIAGNOSIS — E119 Type 2 diabetes mellitus without complications: Secondary | ICD-10-CM | POA: Diagnosis not present

## 2019-09-06 DIAGNOSIS — E1165 Type 2 diabetes mellitus with hyperglycemia: Secondary | ICD-10-CM | POA: Diagnosis not present

## 2019-09-06 DIAGNOSIS — M810 Age-related osteoporosis without current pathological fracture: Secondary | ICD-10-CM | POA: Diagnosis not present

## 2019-09-06 DIAGNOSIS — I10 Essential (primary) hypertension: Secondary | ICD-10-CM | POA: Diagnosis not present

## 2019-09-06 DIAGNOSIS — E7800 Pure hypercholesterolemia, unspecified: Secondary | ICD-10-CM | POA: Diagnosis not present

## 2019-10-04 DIAGNOSIS — E7800 Pure hypercholesterolemia, unspecified: Secondary | ICD-10-CM | POA: Diagnosis not present

## 2019-10-04 DIAGNOSIS — E1165 Type 2 diabetes mellitus with hyperglycemia: Secondary | ICD-10-CM | POA: Diagnosis not present

## 2019-10-04 DIAGNOSIS — M810 Age-related osteoporosis without current pathological fracture: Secondary | ICD-10-CM | POA: Diagnosis not present

## 2019-10-04 DIAGNOSIS — I10 Essential (primary) hypertension: Secondary | ICD-10-CM | POA: Diagnosis not present

## 2019-10-04 DIAGNOSIS — E119 Type 2 diabetes mellitus without complications: Secondary | ICD-10-CM | POA: Diagnosis not present

## 2019-11-08 DIAGNOSIS — M810 Age-related osteoporosis without current pathological fracture: Secondary | ICD-10-CM | POA: Diagnosis not present

## 2019-11-08 DIAGNOSIS — E119 Type 2 diabetes mellitus without complications: Secondary | ICD-10-CM | POA: Diagnosis not present

## 2019-11-08 DIAGNOSIS — E7800 Pure hypercholesterolemia, unspecified: Secondary | ICD-10-CM | POA: Diagnosis not present

## 2019-11-08 DIAGNOSIS — I10 Essential (primary) hypertension: Secondary | ICD-10-CM | POA: Diagnosis not present

## 2019-11-24 DIAGNOSIS — E119 Type 2 diabetes mellitus without complications: Secondary | ICD-10-CM | POA: Diagnosis not present

## 2019-12-06 DIAGNOSIS — E7800 Pure hypercholesterolemia, unspecified: Secondary | ICD-10-CM | POA: Diagnosis not present

## 2019-12-06 DIAGNOSIS — M810 Age-related osteoporosis without current pathological fracture: Secondary | ICD-10-CM | POA: Diagnosis not present

## 2019-12-06 DIAGNOSIS — E1165 Type 2 diabetes mellitus with hyperglycemia: Secondary | ICD-10-CM | POA: Diagnosis not present

## 2019-12-06 DIAGNOSIS — I10 Essential (primary) hypertension: Secondary | ICD-10-CM | POA: Diagnosis not present

## 2019-12-19 ENCOUNTER — Other Ambulatory Visit: Payer: Self-pay | Admitting: Internal Medicine

## 2019-12-19 DIAGNOSIS — Z1231 Encounter for screening mammogram for malignant neoplasm of breast: Secondary | ICD-10-CM

## 2019-12-21 DIAGNOSIS — R69 Illness, unspecified: Secondary | ICD-10-CM | POA: Diagnosis not present

## 2019-12-30 DIAGNOSIS — R69 Illness, unspecified: Secondary | ICD-10-CM | POA: Diagnosis not present

## 2020-01-04 DIAGNOSIS — E1165 Type 2 diabetes mellitus with hyperglycemia: Secondary | ICD-10-CM | POA: Diagnosis not present

## 2020-01-04 DIAGNOSIS — M810 Age-related osteoporosis without current pathological fracture: Secondary | ICD-10-CM | POA: Diagnosis not present

## 2020-01-04 DIAGNOSIS — E7800 Pure hypercholesterolemia, unspecified: Secondary | ICD-10-CM | POA: Diagnosis not present

## 2020-01-04 DIAGNOSIS — I10 Essential (primary) hypertension: Secondary | ICD-10-CM | POA: Diagnosis not present

## 2020-01-09 ENCOUNTER — Other Ambulatory Visit: Payer: Self-pay

## 2020-01-09 ENCOUNTER — Ambulatory Visit
Admission: RE | Admit: 2020-01-09 | Discharge: 2020-01-09 | Disposition: A | Discharge status: Home / Self Care | Payer: Medicare HMO | Source: Ambulatory Visit | Attending: Internal Medicine | Admitting: Internal Medicine

## 2020-01-09 DIAGNOSIS — Z1231 Encounter for screening mammogram for malignant neoplasm of breast: Secondary | ICD-10-CM

## 2020-01-10 DIAGNOSIS — R69 Illness, unspecified: Secondary | ICD-10-CM | POA: Diagnosis not present

## 2020-01-26 DIAGNOSIS — M810 Age-related osteoporosis without current pathological fracture: Secondary | ICD-10-CM | POA: Diagnosis not present

## 2020-01-26 DIAGNOSIS — E1165 Type 2 diabetes mellitus with hyperglycemia: Secondary | ICD-10-CM | POA: Diagnosis not present

## 2020-01-26 DIAGNOSIS — I10 Essential (primary) hypertension: Secondary | ICD-10-CM | POA: Diagnosis not present

## 2020-01-26 DIAGNOSIS — Z7984 Long term (current) use of oral hypoglycemic drugs: Secondary | ICD-10-CM | POA: Diagnosis not present

## 2020-01-26 DIAGNOSIS — E7800 Pure hypercholesterolemia, unspecified: Secondary | ICD-10-CM | POA: Diagnosis not present

## 2020-01-26 DIAGNOSIS — E119 Type 2 diabetes mellitus without complications: Secondary | ICD-10-CM | POA: Diagnosis not present

## 2020-02-07 DIAGNOSIS — R69 Illness, unspecified: Secondary | ICD-10-CM | POA: Diagnosis not present

## 2020-02-20 DIAGNOSIS — R69 Illness, unspecified: Secondary | ICD-10-CM | POA: Diagnosis not present

## 2020-02-29 DIAGNOSIS — R69 Illness, unspecified: Secondary | ICD-10-CM | POA: Diagnosis not present

## 2020-03-21 DIAGNOSIS — M810 Age-related osteoporosis without current pathological fracture: Secondary | ICD-10-CM | POA: Diagnosis not present

## 2020-03-21 DIAGNOSIS — I10 Essential (primary) hypertension: Secondary | ICD-10-CM | POA: Diagnosis not present

## 2020-03-21 DIAGNOSIS — R69 Illness, unspecified: Secondary | ICD-10-CM | POA: Diagnosis not present

## 2020-03-21 DIAGNOSIS — E7800 Pure hypercholesterolemia, unspecified: Secondary | ICD-10-CM | POA: Diagnosis not present

## 2020-03-21 DIAGNOSIS — E1165 Type 2 diabetes mellitus with hyperglycemia: Secondary | ICD-10-CM | POA: Diagnosis not present

## 2020-03-28 DIAGNOSIS — R69 Illness, unspecified: Secondary | ICD-10-CM | POA: Diagnosis not present

## 2020-04-19 DIAGNOSIS — E559 Vitamin D deficiency, unspecified: Secondary | ICD-10-CM | POA: Diagnosis not present

## 2020-04-19 DIAGNOSIS — E7800 Pure hypercholesterolemia, unspecified: Secondary | ICD-10-CM | POA: Diagnosis not present

## 2020-04-19 DIAGNOSIS — Z0001 Encounter for general adult medical examination with abnormal findings: Secondary | ICD-10-CM | POA: Diagnosis not present

## 2020-04-19 DIAGNOSIS — R197 Diarrhea, unspecified: Secondary | ICD-10-CM | POA: Diagnosis not present

## 2020-04-19 DIAGNOSIS — Z1389 Encounter for screening for other disorder: Secondary | ICD-10-CM | POA: Diagnosis not present

## 2020-04-19 DIAGNOSIS — J301 Allergic rhinitis due to pollen: Secondary | ICD-10-CM | POA: Diagnosis not present

## 2020-04-19 DIAGNOSIS — I10 Essential (primary) hypertension: Secondary | ICD-10-CM | POA: Diagnosis not present

## 2020-04-19 DIAGNOSIS — Z79899 Other long term (current) drug therapy: Secondary | ICD-10-CM | POA: Diagnosis not present

## 2020-04-19 DIAGNOSIS — M810 Age-related osteoporosis without current pathological fracture: Secondary | ICD-10-CM | POA: Diagnosis not present

## 2020-04-19 DIAGNOSIS — E1165 Type 2 diabetes mellitus with hyperglycemia: Secondary | ICD-10-CM | POA: Diagnosis not present

## 2020-05-22 DIAGNOSIS — E7800 Pure hypercholesterolemia, unspecified: Secondary | ICD-10-CM | POA: Diagnosis not present

## 2020-05-22 DIAGNOSIS — M810 Age-related osteoporosis without current pathological fracture: Secondary | ICD-10-CM | POA: Diagnosis not present

## 2020-05-22 DIAGNOSIS — E1165 Type 2 diabetes mellitus with hyperglycemia: Secondary | ICD-10-CM | POA: Diagnosis not present

## 2020-05-22 DIAGNOSIS — I10 Essential (primary) hypertension: Secondary | ICD-10-CM | POA: Diagnosis not present

## 2020-06-12 DIAGNOSIS — L820 Inflamed seborrheic keratosis: Secondary | ICD-10-CM | POA: Diagnosis not present

## 2020-06-12 DIAGNOSIS — L821 Other seborrheic keratosis: Secondary | ICD-10-CM | POA: Diagnosis not present

## 2020-06-12 DIAGNOSIS — Z85828 Personal history of other malignant neoplasm of skin: Secondary | ICD-10-CM | POA: Diagnosis not present

## 2020-06-12 DIAGNOSIS — D225 Melanocytic nevi of trunk: Secondary | ICD-10-CM | POA: Diagnosis not present

## 2020-06-12 DIAGNOSIS — L720 Epidermal cyst: Secondary | ICD-10-CM | POA: Diagnosis not present

## 2020-06-12 DIAGNOSIS — L570 Actinic keratosis: Secondary | ICD-10-CM | POA: Diagnosis not present

## 2020-06-20 DIAGNOSIS — E7800 Pure hypercholesterolemia, unspecified: Secondary | ICD-10-CM | POA: Diagnosis not present

## 2020-06-20 DIAGNOSIS — E1165 Type 2 diabetes mellitus with hyperglycemia: Secondary | ICD-10-CM | POA: Diagnosis not present

## 2020-06-20 DIAGNOSIS — I10 Essential (primary) hypertension: Secondary | ICD-10-CM | POA: Diagnosis not present

## 2020-06-20 DIAGNOSIS — M810 Age-related osteoporosis without current pathological fracture: Secondary | ICD-10-CM | POA: Diagnosis not present

## 2020-07-18 DIAGNOSIS — E7800 Pure hypercholesterolemia, unspecified: Secondary | ICD-10-CM | POA: Diagnosis not present

## 2020-07-18 DIAGNOSIS — M810 Age-related osteoporosis without current pathological fracture: Secondary | ICD-10-CM | POA: Diagnosis not present

## 2020-07-18 DIAGNOSIS — E1165 Type 2 diabetes mellitus with hyperglycemia: Secondary | ICD-10-CM | POA: Diagnosis not present

## 2020-07-18 DIAGNOSIS — I10 Essential (primary) hypertension: Secondary | ICD-10-CM | POA: Diagnosis not present

## 2020-08-09 DIAGNOSIS — I10 Essential (primary) hypertension: Secondary | ICD-10-CM | POA: Diagnosis not present

## 2020-08-09 DIAGNOSIS — E1165 Type 2 diabetes mellitus with hyperglycemia: Secondary | ICD-10-CM | POA: Diagnosis not present

## 2020-08-09 DIAGNOSIS — E7800 Pure hypercholesterolemia, unspecified: Secondary | ICD-10-CM | POA: Diagnosis not present

## 2020-08-09 DIAGNOSIS — M810 Age-related osteoporosis without current pathological fracture: Secondary | ICD-10-CM | POA: Diagnosis not present

## 2020-08-14 DIAGNOSIS — H2513 Age-related nuclear cataract, bilateral: Secondary | ICD-10-CM | POA: Diagnosis not present

## 2020-08-14 DIAGNOSIS — E119 Type 2 diabetes mellitus without complications: Secondary | ICD-10-CM | POA: Diagnosis not present

## 2020-08-14 DIAGNOSIS — H5203 Hypermetropia, bilateral: Secondary | ICD-10-CM | POA: Diagnosis not present

## 2020-09-18 DIAGNOSIS — E1165 Type 2 diabetes mellitus with hyperglycemia: Secondary | ICD-10-CM | POA: Diagnosis not present

## 2020-09-18 DIAGNOSIS — M810 Age-related osteoporosis without current pathological fracture: Secondary | ICD-10-CM | POA: Diagnosis not present

## 2020-09-18 DIAGNOSIS — E7800 Pure hypercholesterolemia, unspecified: Secondary | ICD-10-CM | POA: Diagnosis not present

## 2020-09-18 DIAGNOSIS — I10 Essential (primary) hypertension: Secondary | ICD-10-CM | POA: Diagnosis not present

## 2020-09-21 DIAGNOSIS — M810 Age-related osteoporosis without current pathological fracture: Secondary | ICD-10-CM | POA: Diagnosis not present

## 2020-09-21 DIAGNOSIS — E7800 Pure hypercholesterolemia, unspecified: Secondary | ICD-10-CM | POA: Diagnosis not present

## 2020-09-21 DIAGNOSIS — E1165 Type 2 diabetes mellitus with hyperglycemia: Secondary | ICD-10-CM | POA: Diagnosis not present

## 2020-09-21 DIAGNOSIS — I10 Essential (primary) hypertension: Secondary | ICD-10-CM | POA: Diagnosis not present

## 2020-10-18 DIAGNOSIS — Z7984 Long term (current) use of oral hypoglycemic drugs: Secondary | ICD-10-CM | POA: Diagnosis not present

## 2020-10-18 DIAGNOSIS — E559 Vitamin D deficiency, unspecified: Secondary | ICD-10-CM | POA: Diagnosis not present

## 2020-10-18 DIAGNOSIS — E1165 Type 2 diabetes mellitus with hyperglycemia: Secondary | ICD-10-CM | POA: Diagnosis not present

## 2020-10-18 DIAGNOSIS — E7800 Pure hypercholesterolemia, unspecified: Secondary | ICD-10-CM | POA: Diagnosis not present

## 2020-10-18 DIAGNOSIS — J301 Allergic rhinitis due to pollen: Secondary | ICD-10-CM | POA: Diagnosis not present

## 2020-10-18 DIAGNOSIS — Z79899 Other long term (current) drug therapy: Secondary | ICD-10-CM | POA: Diagnosis not present

## 2020-10-18 DIAGNOSIS — M810 Age-related osteoporosis without current pathological fracture: Secondary | ICD-10-CM | POA: Diagnosis not present

## 2020-10-18 DIAGNOSIS — I10 Essential (primary) hypertension: Secondary | ICD-10-CM | POA: Diagnosis not present

## 2020-10-24 DIAGNOSIS — E1165 Type 2 diabetes mellitus with hyperglycemia: Secondary | ICD-10-CM | POA: Diagnosis not present

## 2020-10-24 DIAGNOSIS — Z7984 Long term (current) use of oral hypoglycemic drugs: Secondary | ICD-10-CM | POA: Diagnosis not present

## 2020-11-20 DIAGNOSIS — E7800 Pure hypercholesterolemia, unspecified: Secondary | ICD-10-CM | POA: Diagnosis not present

## 2020-11-20 DIAGNOSIS — I10 Essential (primary) hypertension: Secondary | ICD-10-CM | POA: Diagnosis not present

## 2020-11-20 DIAGNOSIS — E1165 Type 2 diabetes mellitus with hyperglycemia: Secondary | ICD-10-CM | POA: Diagnosis not present

## 2020-11-20 DIAGNOSIS — M810 Age-related osteoporosis without current pathological fracture: Secondary | ICD-10-CM | POA: Diagnosis not present

## 2020-12-20 DIAGNOSIS — M810 Age-related osteoporosis without current pathological fracture: Secondary | ICD-10-CM | POA: Diagnosis not present

## 2020-12-20 DIAGNOSIS — E1165 Type 2 diabetes mellitus with hyperglycemia: Secondary | ICD-10-CM | POA: Diagnosis not present

## 2020-12-20 DIAGNOSIS — E7800 Pure hypercholesterolemia, unspecified: Secondary | ICD-10-CM | POA: Diagnosis not present

## 2020-12-20 DIAGNOSIS — I10 Essential (primary) hypertension: Secondary | ICD-10-CM | POA: Diagnosis not present

## 2020-12-26 ENCOUNTER — Other Ambulatory Visit: Payer: Self-pay | Admitting: Internal Medicine

## 2020-12-26 DIAGNOSIS — Z1231 Encounter for screening mammogram for malignant neoplasm of breast: Secondary | ICD-10-CM

## 2021-02-19 ENCOUNTER — Other Ambulatory Visit: Payer: Self-pay

## 2021-02-19 ENCOUNTER — Ambulatory Visit
Admission: RE | Admit: 2021-02-19 | Discharge: 2021-02-19 | Disposition: A | Discharge status: Home / Self Care | Payer: Medicare HMO | Source: Ambulatory Visit | Attending: Internal Medicine | Admitting: Internal Medicine

## 2021-02-19 DIAGNOSIS — Z1231 Encounter for screening mammogram for malignant neoplasm of breast: Secondary | ICD-10-CM | POA: Diagnosis not present

## 2021-03-11 DIAGNOSIS — E7800 Pure hypercholesterolemia, unspecified: Secondary | ICD-10-CM | POA: Diagnosis not present

## 2021-03-11 DIAGNOSIS — I10 Essential (primary) hypertension: Secondary | ICD-10-CM | POA: Diagnosis not present

## 2021-03-11 DIAGNOSIS — E1165 Type 2 diabetes mellitus with hyperglycemia: Secondary | ICD-10-CM | POA: Diagnosis not present

## 2021-03-11 DIAGNOSIS — M810 Age-related osteoporosis without current pathological fracture: Secondary | ICD-10-CM | POA: Diagnosis not present

## 2021-04-24 ENCOUNTER — Other Ambulatory Visit: Payer: Self-pay | Admitting: Internal Medicine

## 2021-04-24 DIAGNOSIS — Z79899 Other long term (current) drug therapy: Secondary | ICD-10-CM | POA: Diagnosis not present

## 2021-04-24 DIAGNOSIS — E559 Vitamin D deficiency, unspecified: Secondary | ICD-10-CM | POA: Diagnosis not present

## 2021-04-24 DIAGNOSIS — E1165 Type 2 diabetes mellitus with hyperglycemia: Secondary | ICD-10-CM | POA: Diagnosis not present

## 2021-04-24 DIAGNOSIS — J301 Allergic rhinitis due to pollen: Secondary | ICD-10-CM | POA: Diagnosis not present

## 2021-04-24 DIAGNOSIS — I10 Essential (primary) hypertension: Secondary | ICD-10-CM | POA: Diagnosis not present

## 2021-04-24 DIAGNOSIS — Z13820 Encounter for screening for osteoporosis: Secondary | ICD-10-CM

## 2021-04-24 DIAGNOSIS — Z0001 Encounter for general adult medical examination with abnormal findings: Secondary | ICD-10-CM | POA: Diagnosis not present

## 2021-04-24 DIAGNOSIS — Z7984 Long term (current) use of oral hypoglycemic drugs: Secondary | ICD-10-CM | POA: Diagnosis not present

## 2021-04-24 DIAGNOSIS — E7800 Pure hypercholesterolemia, unspecified: Secondary | ICD-10-CM | POA: Diagnosis not present

## 2021-04-24 DIAGNOSIS — M810 Age-related osteoporosis without current pathological fracture: Secondary | ICD-10-CM | POA: Diagnosis not present

## 2021-04-30 ENCOUNTER — Other Ambulatory Visit: Payer: Self-pay | Admitting: Internal Medicine

## 2021-04-30 DIAGNOSIS — M810 Age-related osteoporosis without current pathological fracture: Secondary | ICD-10-CM

## 2021-05-15 DIAGNOSIS — M810 Age-related osteoporosis without current pathological fracture: Secondary | ICD-10-CM | POA: Diagnosis not present

## 2021-05-15 DIAGNOSIS — E7800 Pure hypercholesterolemia, unspecified: Secondary | ICD-10-CM | POA: Diagnosis not present

## 2021-05-15 DIAGNOSIS — E1165 Type 2 diabetes mellitus with hyperglycemia: Secondary | ICD-10-CM | POA: Diagnosis not present

## 2021-05-15 DIAGNOSIS — I10 Essential (primary) hypertension: Secondary | ICD-10-CM | POA: Diagnosis not present

## 2021-06-20 DIAGNOSIS — E7800 Pure hypercholesterolemia, unspecified: Secondary | ICD-10-CM | POA: Diagnosis not present

## 2021-06-20 DIAGNOSIS — M810 Age-related osteoporosis without current pathological fracture: Secondary | ICD-10-CM | POA: Diagnosis not present

## 2021-06-20 DIAGNOSIS — I10 Essential (primary) hypertension: Secondary | ICD-10-CM | POA: Diagnosis not present

## 2021-06-20 DIAGNOSIS — E1165 Type 2 diabetes mellitus with hyperglycemia: Secondary | ICD-10-CM | POA: Diagnosis not present

## 2021-07-17 DIAGNOSIS — D2239 Melanocytic nevi of other parts of face: Secondary | ICD-10-CM | POA: Diagnosis not present

## 2021-07-17 DIAGNOSIS — D225 Melanocytic nevi of trunk: Secondary | ICD-10-CM | POA: Diagnosis not present

## 2021-07-17 DIAGNOSIS — L570 Actinic keratosis: Secondary | ICD-10-CM | POA: Diagnosis not present

## 2021-07-17 DIAGNOSIS — L821 Other seborrheic keratosis: Secondary | ICD-10-CM | POA: Diagnosis not present

## 2021-07-17 DIAGNOSIS — Z85828 Personal history of other malignant neoplasm of skin: Secondary | ICD-10-CM | POA: Diagnosis not present

## 2021-08-28 DIAGNOSIS — E119 Type 2 diabetes mellitus without complications: Secondary | ICD-10-CM | POA: Diagnosis not present

## 2021-08-28 DIAGNOSIS — H2513 Age-related nuclear cataract, bilateral: Secondary | ICD-10-CM | POA: Diagnosis not present

## 2021-08-28 DIAGNOSIS — H5203 Hypermetropia, bilateral: Secondary | ICD-10-CM | POA: Diagnosis not present

## 2021-08-28 DIAGNOSIS — H53002 Unspecified amblyopia, left eye: Secondary | ICD-10-CM | POA: Diagnosis not present

## 2021-08-29 DIAGNOSIS — I10 Essential (primary) hypertension: Secondary | ICD-10-CM | POA: Diagnosis not present

## 2021-08-29 DIAGNOSIS — M810 Age-related osteoporosis without current pathological fracture: Secondary | ICD-10-CM | POA: Diagnosis not present

## 2021-08-29 DIAGNOSIS — J301 Allergic rhinitis due to pollen: Secondary | ICD-10-CM | POA: Diagnosis not present

## 2021-08-29 DIAGNOSIS — E1165 Type 2 diabetes mellitus with hyperglycemia: Secondary | ICD-10-CM | POA: Diagnosis not present

## 2021-08-29 DIAGNOSIS — E559 Vitamin D deficiency, unspecified: Secondary | ICD-10-CM | POA: Diagnosis not present

## 2021-08-29 DIAGNOSIS — Z79899 Other long term (current) drug therapy: Secondary | ICD-10-CM | POA: Diagnosis not present

## 2021-08-29 DIAGNOSIS — E7800 Pure hypercholesterolemia, unspecified: Secondary | ICD-10-CM | POA: Diagnosis not present

## 2021-09-17 DIAGNOSIS — E1165 Type 2 diabetes mellitus with hyperglycemia: Secondary | ICD-10-CM | POA: Diagnosis not present

## 2021-09-17 DIAGNOSIS — I10 Essential (primary) hypertension: Secondary | ICD-10-CM | POA: Diagnosis not present

## 2021-09-17 DIAGNOSIS — E7800 Pure hypercholesterolemia, unspecified: Secondary | ICD-10-CM | POA: Diagnosis not present

## 2021-09-24 ENCOUNTER — Other Ambulatory Visit: Payer: Medicare HMO

## 2021-09-30 DIAGNOSIS — M810 Age-related osteoporosis without current pathological fracture: Secondary | ICD-10-CM | POA: Diagnosis not present

## 2021-09-30 DIAGNOSIS — E7800 Pure hypercholesterolemia, unspecified: Secondary | ICD-10-CM | POA: Diagnosis not present

## 2021-09-30 DIAGNOSIS — E1165 Type 2 diabetes mellitus with hyperglycemia: Secondary | ICD-10-CM | POA: Diagnosis not present

## 2021-09-30 DIAGNOSIS — I10 Essential (primary) hypertension: Secondary | ICD-10-CM | POA: Diagnosis not present

## 2021-12-10 DIAGNOSIS — E1165 Type 2 diabetes mellitus with hyperglycemia: Secondary | ICD-10-CM | POA: Diagnosis not present

## 2021-12-10 DIAGNOSIS — M810 Age-related osteoporosis without current pathological fracture: Secondary | ICD-10-CM | POA: Diagnosis not present

## 2021-12-10 DIAGNOSIS — E7800 Pure hypercholesterolemia, unspecified: Secondary | ICD-10-CM | POA: Diagnosis not present

## 2021-12-10 DIAGNOSIS — I10 Essential (primary) hypertension: Secondary | ICD-10-CM | POA: Diagnosis not present

## 2022-01-22 IMAGING — MG MM DIGITAL SCREENING BILAT W/ TOMO AND CAD
8 series · 9 of 24 positions shown · non-contrast
Comparison: Previous exam(s).

CLINICAL DATA: Screening.

EXAM:
DIGITAL SCREENING BILATERAL MAMMOGRAM WITH TOMOSYNTHESIS AND CAD
TECHNIQUE: Bilateral screening digital craniocaudal and mediolateral oblique
mammograms were obtained. Bilateral screening digital breast
tomosynthesis was performed. The images were evaluated with
computer-aided detection.

[L MLO synth-2D]
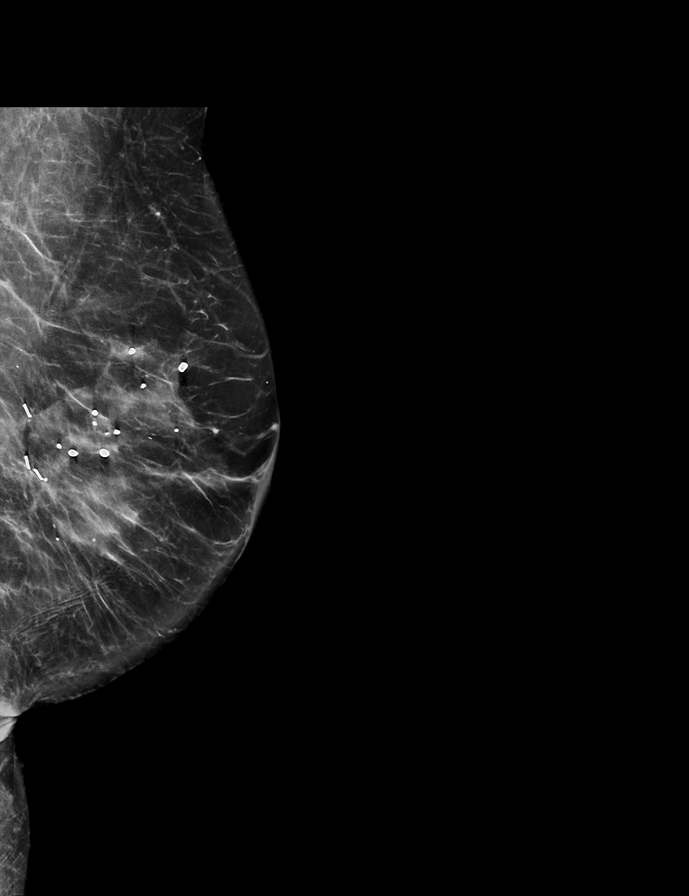

[R MLO synth-2D]
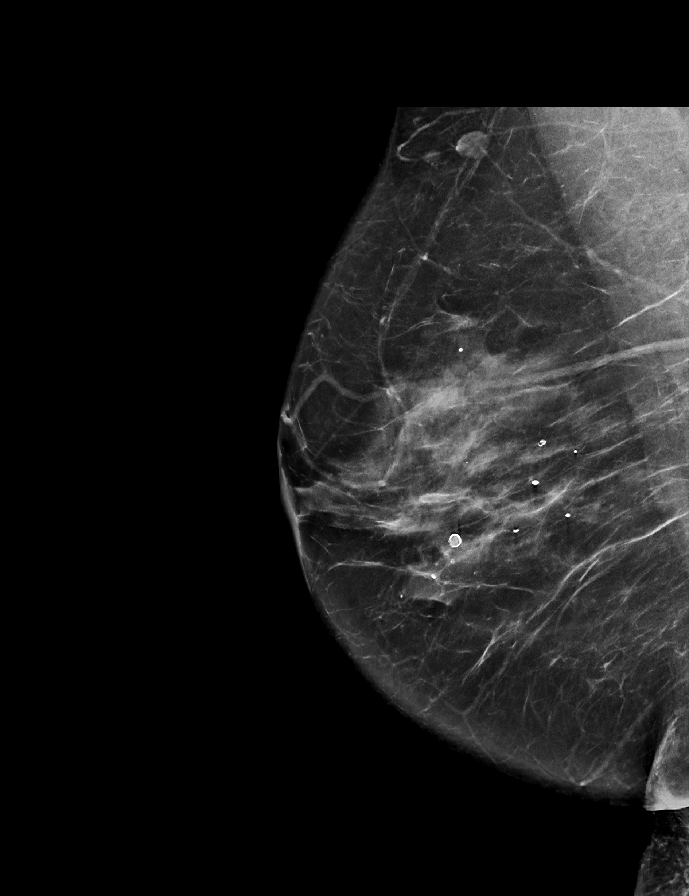

[R CC synth-2D]
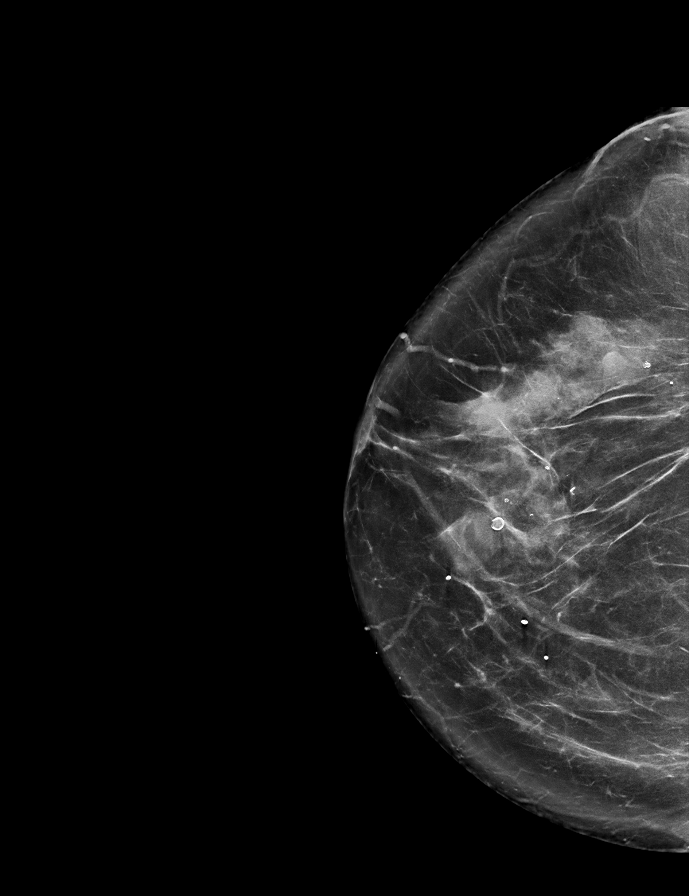

[L CC synth-2D]
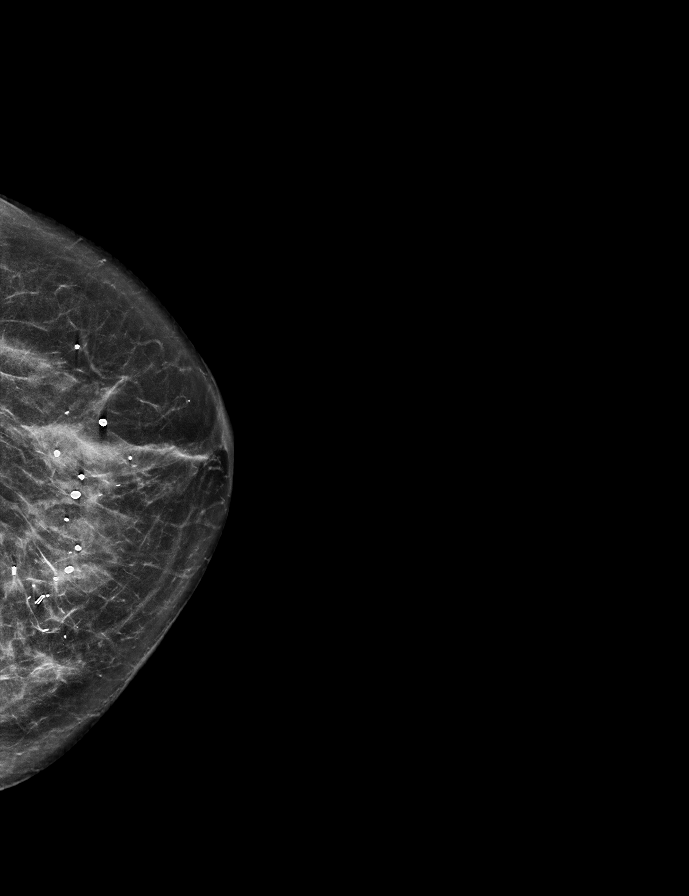

[L MLO tomo · 2 of 66 frames shown]
[frame 22/66]
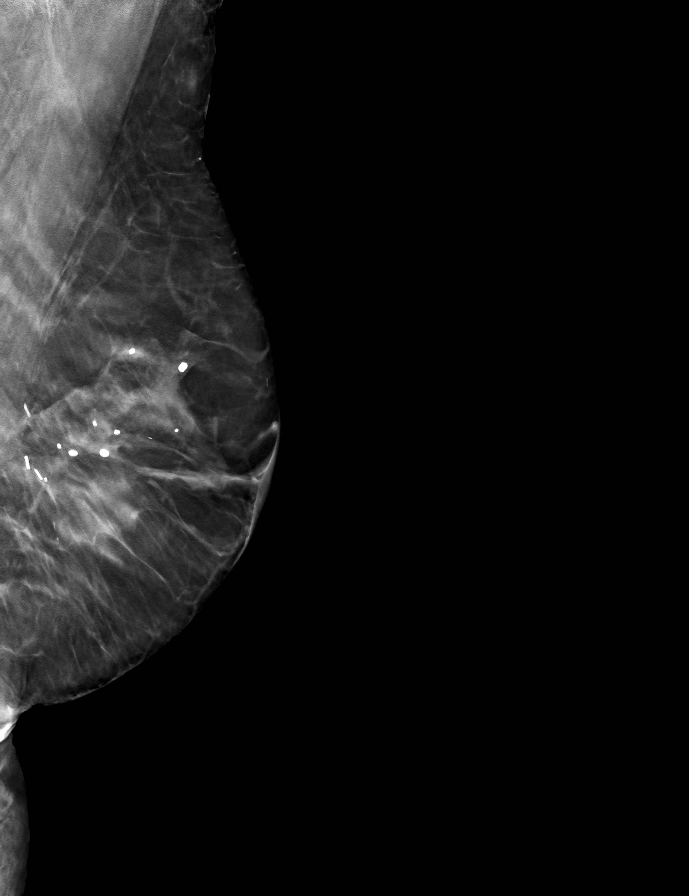
[frame 33/66]
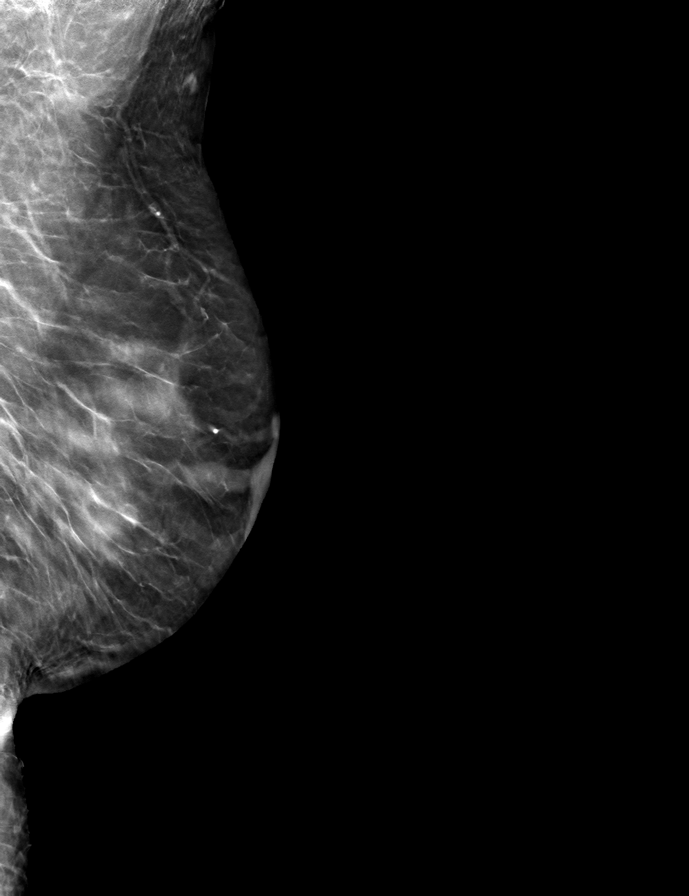

[L CC tomo · tomo slice 30/59.0]
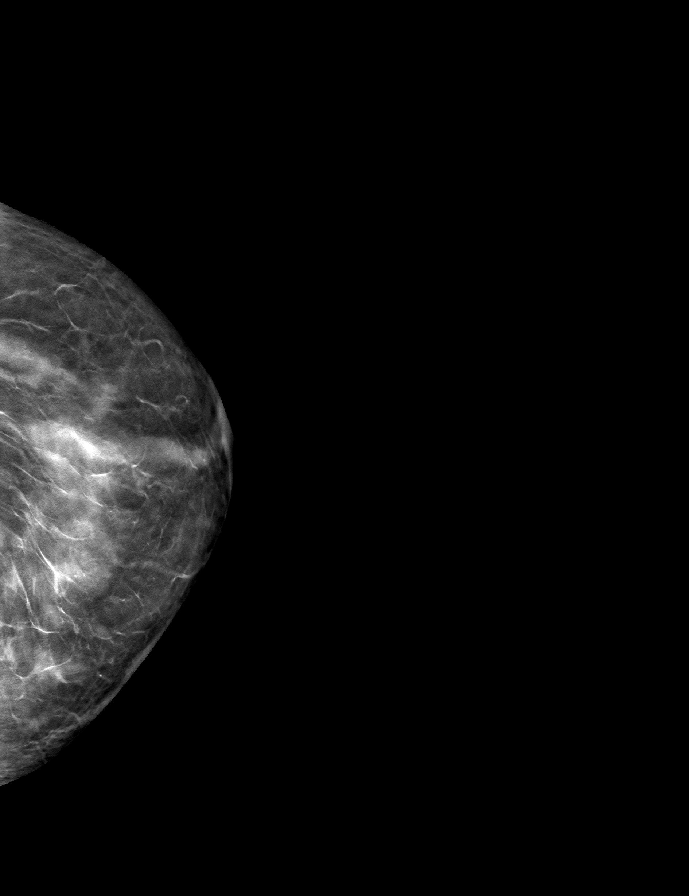

[R MLO tomo · tomo slice 37/73.0]
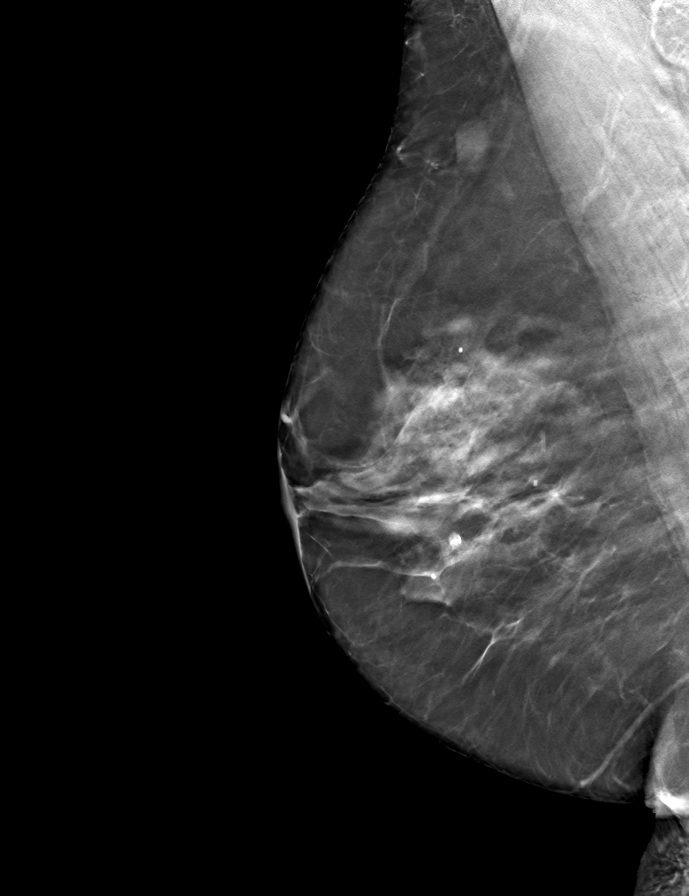

[R CC tomo · tomo slice 38/75.0]
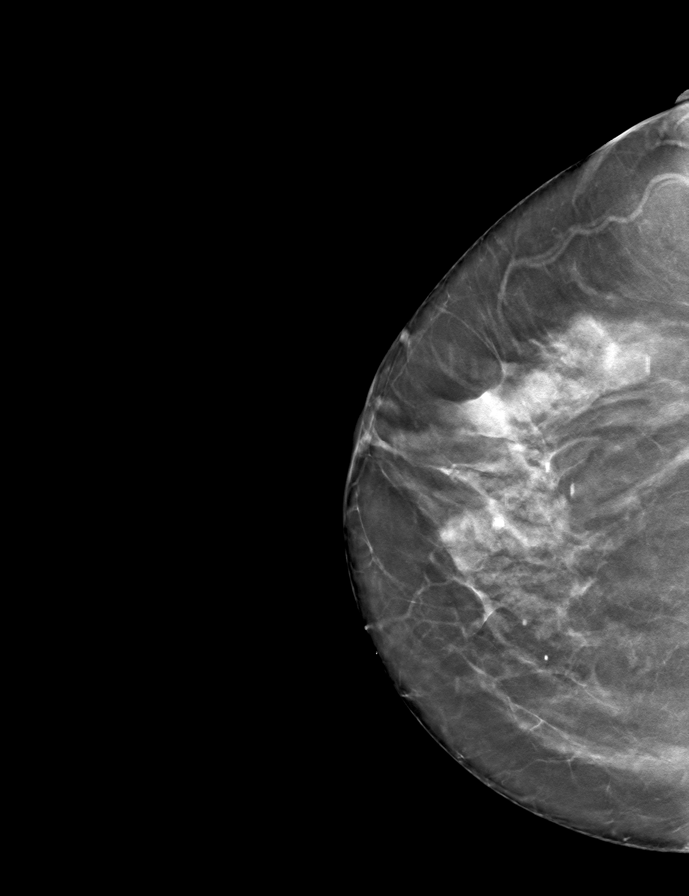

[9 of 24 positions shown; findings below may reference images not displayed]

ACR Breast Density Category c: The breast tissue is heterogeneously
dense, which may obscure small masses.
FINDINGS: There are no findings suspicious for malignancy.
IMPRESSION: No mammographic evidence of malignancy. A result letter of this
screening mammogram will be mailed directly to the patient.

RECOMMENDATION:
Screening mammogram in one year. (Code:Q3-W-BC3)

BI-RADS CATEGORY  1: Negative.

## 2022-01-28 ENCOUNTER — Other Ambulatory Visit: Payer: Self-pay | Admitting: Internal Medicine

## 2022-01-28 DIAGNOSIS — Z1231 Encounter for screening mammogram for malignant neoplasm of breast: Secondary | ICD-10-CM

## 2022-03-13 ENCOUNTER — Ambulatory Visit
Admission: RE | Admit: 2022-03-13 | Discharge: 2022-03-13 | Disposition: A | Discharge status: Home / Self Care | Payer: Medicare HMO | Source: Ambulatory Visit | Attending: Internal Medicine | Admitting: Internal Medicine

## 2022-03-13 DIAGNOSIS — Z780 Asymptomatic menopausal state: Secondary | ICD-10-CM | POA: Diagnosis not present

## 2022-03-13 DIAGNOSIS — Z1231 Encounter for screening mammogram for malignant neoplasm of breast: Secondary | ICD-10-CM

## 2022-03-13 DIAGNOSIS — M810 Age-related osteoporosis without current pathological fracture: Secondary | ICD-10-CM | POA: Diagnosis not present

## 2022-05-08 DIAGNOSIS — Z79899 Other long term (current) drug therapy: Secondary | ICD-10-CM | POA: Diagnosis not present

## 2022-05-08 DIAGNOSIS — E7800 Pure hypercholesterolemia, unspecified: Secondary | ICD-10-CM | POA: Diagnosis not present

## 2022-05-08 DIAGNOSIS — Z1331 Encounter for screening for depression: Secondary | ICD-10-CM | POA: Diagnosis not present

## 2022-05-08 DIAGNOSIS — M810 Age-related osteoporosis without current pathological fracture: Secondary | ICD-10-CM | POA: Diagnosis not present

## 2022-05-08 DIAGNOSIS — E559 Vitamin D deficiency, unspecified: Secondary | ICD-10-CM | POA: Diagnosis not present

## 2022-05-08 DIAGNOSIS — E1165 Type 2 diabetes mellitus with hyperglycemia: Secondary | ICD-10-CM | POA: Diagnosis not present

## 2022-05-08 DIAGNOSIS — Z0000 Encounter for general adult medical examination without abnormal findings: Secondary | ICD-10-CM | POA: Diagnosis not present

## 2022-05-08 DIAGNOSIS — I10 Essential (primary) hypertension: Secondary | ICD-10-CM | POA: Diagnosis not present

## 2022-09-09 DIAGNOSIS — E119 Type 2 diabetes mellitus without complications: Secondary | ICD-10-CM | POA: Diagnosis not present

## 2022-09-09 DIAGNOSIS — H53002 Unspecified amblyopia, left eye: Secondary | ICD-10-CM | POA: Diagnosis not present

## 2022-09-09 DIAGNOSIS — H5203 Hypermetropia, bilateral: Secondary | ICD-10-CM | POA: Diagnosis not present

## 2022-09-09 DIAGNOSIS — H25813 Combined forms of age-related cataract, bilateral: Secondary | ICD-10-CM | POA: Diagnosis not present

## 2022-09-12 DIAGNOSIS — Z85828 Personal history of other malignant neoplasm of skin: Secondary | ICD-10-CM | POA: Diagnosis not present

## 2022-09-12 DIAGNOSIS — D692 Other nonthrombocytopenic purpura: Secondary | ICD-10-CM | POA: Diagnosis not present

## 2022-09-12 DIAGNOSIS — L821 Other seborrheic keratosis: Secondary | ICD-10-CM | POA: Diagnosis not present

## 2022-09-12 DIAGNOSIS — D225 Melanocytic nevi of trunk: Secondary | ICD-10-CM | POA: Diagnosis not present

## 2022-09-12 DIAGNOSIS — L570 Actinic keratosis: Secondary | ICD-10-CM | POA: Diagnosis not present

## 2022-09-26 DIAGNOSIS — E1165 Type 2 diabetes mellitus with hyperglycemia: Secondary | ICD-10-CM | POA: Diagnosis not present

## 2022-09-26 DIAGNOSIS — I10 Essential (primary) hypertension: Secondary | ICD-10-CM | POA: Diagnosis not present

## 2023-02-17 ENCOUNTER — Other Ambulatory Visit: Payer: Self-pay | Admitting: Internal Medicine

## 2023-02-17 DIAGNOSIS — Z1231 Encounter for screening mammogram for malignant neoplasm of breast: Secondary | ICD-10-CM

## 2023-03-19 ENCOUNTER — Ambulatory Visit: Payer: Medicare HMO

## 2023-03-27 ENCOUNTER — Ambulatory Visit
Admission: RE | Admit: 2023-03-27 | Discharge: 2023-03-27 | Disposition: A | Discharge status: Home / Self Care | Payer: Medicare HMO | Source: Ambulatory Visit | Attending: Internal Medicine | Admitting: Internal Medicine

## 2023-03-27 DIAGNOSIS — Z1231 Encounter for screening mammogram for malignant neoplasm of breast: Secondary | ICD-10-CM | POA: Diagnosis not present

## 2023-05-18 DIAGNOSIS — Z0000 Encounter for general adult medical examination without abnormal findings: Secondary | ICD-10-CM | POA: Diagnosis not present

## 2023-05-18 DIAGNOSIS — Z1331 Encounter for screening for depression: Secondary | ICD-10-CM | POA: Diagnosis not present

## 2023-08-28 DIAGNOSIS — H5203 Hypermetropia, bilateral: Secondary | ICD-10-CM | POA: Diagnosis not present

## 2023-08-28 DIAGNOSIS — H2513 Age-related nuclear cataract, bilateral: Secondary | ICD-10-CM | POA: Diagnosis not present

## 2023-10-01 DIAGNOSIS — H2512 Age-related nuclear cataract, left eye: Secondary | ICD-10-CM | POA: Diagnosis not present

## 2023-10-01 DIAGNOSIS — H21562 Pupillary abnormality, left eye: Secondary | ICD-10-CM | POA: Diagnosis not present

## 2023-10-01 DIAGNOSIS — Z961 Presence of intraocular lens: Secondary | ICD-10-CM | POA: Diagnosis not present

## 2023-11-24 ENCOUNTER — Other Ambulatory Visit (HOSPITAL_COMMUNITY): Payer: Self-pay | Admitting: Internal Medicine

## 2023-11-24 DIAGNOSIS — R9431 Abnormal electrocardiogram [ECG] [EKG]: Secondary | ICD-10-CM

## 2023-11-26 DIAGNOSIS — H25811 Combined forms of age-related cataract, right eye: Secondary | ICD-10-CM | POA: Diagnosis not present

## 2023-11-26 DIAGNOSIS — H2511 Age-related nuclear cataract, right eye: Secondary | ICD-10-CM | POA: Diagnosis not present

## 2023-11-26 DIAGNOSIS — H21561 Pupillary abnormality, right eye: Secondary | ICD-10-CM | POA: Diagnosis not present

## 2023-11-26 DIAGNOSIS — Z961 Presence of intraocular lens: Secondary | ICD-10-CM | POA: Diagnosis not present

## 2023-12-04 DIAGNOSIS — D0462 Carcinoma in situ of skin of left upper limb, including shoulder: Secondary | ICD-10-CM | POA: Diagnosis not present

## 2023-12-04 DIAGNOSIS — L821 Other seborrheic keratosis: Secondary | ICD-10-CM | POA: Diagnosis not present

## 2023-12-04 DIAGNOSIS — L570 Actinic keratosis: Secondary | ICD-10-CM | POA: Diagnosis not present

## 2023-12-04 DIAGNOSIS — Z85828 Personal history of other malignant neoplasm of skin: Secondary | ICD-10-CM | POA: Diagnosis not present

## 2023-12-04 DIAGNOSIS — D225 Melanocytic nevi of trunk: Secondary | ICD-10-CM | POA: Diagnosis not present

## 2023-12-04 DIAGNOSIS — D692 Other nonthrombocytopenic purpura: Secondary | ICD-10-CM | POA: Diagnosis not present

## 2023-12-07 ENCOUNTER — Ambulatory Visit (HOSPITAL_COMMUNITY)
Admission: RE | Admit: 2023-12-07 | Discharge: 2023-12-07 | Disposition: A | Discharge status: Home / Self Care | Source: Ambulatory Visit | Attending: Cardiology | Admitting: Cardiology

## 2023-12-07 DIAGNOSIS — R9431 Abnormal electrocardiogram [ECG] [EKG]: Secondary | ICD-10-CM

## 2023-12-07 LAB — ECHOCARDIOGRAM COMPLETE
Area-P 1/2: 3.89 cm2
P 1/2 time: 382 ms
S' Lateral: 3 cm

## 2023-12-21 DIAGNOSIS — E7800 Pure hypercholesterolemia, unspecified: Secondary | ICD-10-CM | POA: Diagnosis not present

## 2023-12-21 DIAGNOSIS — I10 Essential (primary) hypertension: Secondary | ICD-10-CM | POA: Diagnosis not present

## 2023-12-21 DIAGNOSIS — E1165 Type 2 diabetes mellitus with hyperglycemia: Secondary | ICD-10-CM | POA: Diagnosis not present

## 2023-12-21 DIAGNOSIS — M810 Age-related osteoporosis without current pathological fracture: Secondary | ICD-10-CM | POA: Diagnosis not present

## 2024-01-21 DIAGNOSIS — M810 Age-related osteoporosis without current pathological fracture: Secondary | ICD-10-CM | POA: Diagnosis not present

## 2024-01-21 DIAGNOSIS — E7800 Pure hypercholesterolemia, unspecified: Secondary | ICD-10-CM | POA: Diagnosis not present

## 2024-01-21 DIAGNOSIS — I10 Essential (primary) hypertension: Secondary | ICD-10-CM | POA: Diagnosis not present

## 2024-01-21 DIAGNOSIS — E1165 Type 2 diabetes mellitus with hyperglycemia: Secondary | ICD-10-CM | POA: Diagnosis not present

## 2024-02-21 DIAGNOSIS — E7800 Pure hypercholesterolemia, unspecified: Secondary | ICD-10-CM | POA: Diagnosis not present

## 2024-02-21 DIAGNOSIS — E1165 Type 2 diabetes mellitus with hyperglycemia: Secondary | ICD-10-CM | POA: Diagnosis not present

## 2024-02-21 DIAGNOSIS — M810 Age-related osteoporosis without current pathological fracture: Secondary | ICD-10-CM | POA: Diagnosis not present

## 2024-02-21 DIAGNOSIS — I10 Essential (primary) hypertension: Secondary | ICD-10-CM | POA: Diagnosis not present

## 2024-03-10 ENCOUNTER — Other Ambulatory Visit: Payer: Self-pay | Admitting: Internal Medicine

## 2024-03-10 DIAGNOSIS — Z1231 Encounter for screening mammogram for malignant neoplasm of breast: Secondary | ICD-10-CM

## 2024-03-22 DIAGNOSIS — I10 Essential (primary) hypertension: Secondary | ICD-10-CM | POA: Diagnosis not present

## 2024-03-22 DIAGNOSIS — E7800 Pure hypercholesterolemia, unspecified: Secondary | ICD-10-CM | POA: Diagnosis not present

## 2024-03-22 DIAGNOSIS — E1165 Type 2 diabetes mellitus with hyperglycemia: Secondary | ICD-10-CM | POA: Diagnosis not present

## 2024-03-22 DIAGNOSIS — M810 Age-related osteoporosis without current pathological fracture: Secondary | ICD-10-CM | POA: Diagnosis not present

## 2024-03-31 ENCOUNTER — Ambulatory Visit
Admission: RE | Admit: 2024-03-31 | Discharge: 2024-03-31 | Disposition: A | Discharge status: Home / Self Care | Source: Ambulatory Visit | Attending: Internal Medicine | Admitting: Internal Medicine

## 2024-03-31 DIAGNOSIS — Z1231 Encounter for screening mammogram for malignant neoplasm of breast: Secondary | ICD-10-CM

## 2024-04-22 DIAGNOSIS — E7800 Pure hypercholesterolemia, unspecified: Secondary | ICD-10-CM | POA: Diagnosis not present

## 2024-04-22 DIAGNOSIS — M810 Age-related osteoporosis without current pathological fracture: Secondary | ICD-10-CM | POA: Diagnosis not present

## 2024-04-22 DIAGNOSIS — I10 Essential (primary) hypertension: Secondary | ICD-10-CM | POA: Diagnosis not present

## 2024-04-22 DIAGNOSIS — E1165 Type 2 diabetes mellitus with hyperglycemia: Secondary | ICD-10-CM | POA: Diagnosis not present

## 2024-05-22 DIAGNOSIS — M810 Age-related osteoporosis without current pathological fracture: Secondary | ICD-10-CM | POA: Diagnosis not present

## 2024-05-22 DIAGNOSIS — E1165 Type 2 diabetes mellitus with hyperglycemia: Secondary | ICD-10-CM | POA: Diagnosis not present

## 2024-05-22 DIAGNOSIS — E7800 Pure hypercholesterolemia, unspecified: Secondary | ICD-10-CM | POA: Diagnosis not present

## 2024-05-22 DIAGNOSIS — I10 Essential (primary) hypertension: Secondary | ICD-10-CM | POA: Diagnosis not present

## 2024-07-07 NOTE — Progress Notes (Signed)
 Tiffany Hunt                                          MRN: [id number]   07/07/2024   The [facility] Quality Team Specialist reviewed this patient medical record for the purposes of chart review for care gap closure. The following were reviewed: chart review for care gap closure-kidney health evaluation for diabetes:eGFR  and uACR.    [facility] Quality Team
